# Patient Record
Sex: Female | Born: 1960 | Race: Black or African American | Hispanic: No | Marital: Single | Smoking: Never smoker
Health system: Southern US, Community
[De-identification: ages and names within clinical notes are randomized; demographics above are authoritative.]

## PROBLEM LIST (undated history)

## (undated) DIAGNOSIS — E669 Obesity, unspecified: Secondary | ICD-10-CM

## (undated) DIAGNOSIS — M2550 Pain in unspecified joint: Secondary | ICD-10-CM

## (undated) DIAGNOSIS — M069 Rheumatoid arthritis, unspecified: Secondary | ICD-10-CM

## (undated) DIAGNOSIS — M549 Dorsalgia, unspecified: Secondary | ICD-10-CM

## (undated) DIAGNOSIS — L930 Discoid lupus erythematosus: Secondary | ICD-10-CM

## (undated) HISTORY — DX: Discoid lupus erythematosus: L93.0

## (undated) HISTORY — DX: Pain in unspecified joint: M25.50

## (undated) HISTORY — PX: MOUTH SURGERY: SHX715

## (undated) HISTORY — DX: Obesity, unspecified: E66.9

## (undated) HISTORY — DX: Dorsalgia, unspecified: M54.9

## (undated) HISTORY — DX: Rheumatoid arthritis, unspecified: M06.9

---

## 2007-04-14 ENCOUNTER — Emergency Department: Payer: Self-pay | Admitting: Emergency Medicine

## 2007-04-18 ENCOUNTER — Emergency Department: Payer: Self-pay | Admitting: Emergency Medicine

## 2010-04-17 ENCOUNTER — Ambulatory Visit: Payer: Self-pay | Admitting: Family Medicine

## 2010-10-05 ENCOUNTER — Ambulatory Visit: Payer: Self-pay | Admitting: Family Medicine

## 2010-10-11 ENCOUNTER — Ambulatory Visit: Payer: Self-pay | Admitting: Family Medicine

## 2010-10-11 ENCOUNTER — Ambulatory Visit: Payer: Self-pay

## 2012-05-26 IMAGING — CR DG LUMBAR SPINE AP/LAT/OBLIQUES W/ FLEX AND EXT
1 series · 5 of 5 positions shown · non-contrast
Comparison: none

REASON FOR EXAM: Low back pain
COMMENTS:

PROCEDURE:     DXR - DXR LUMBAR SPINE WITH OBLIQUES  - October 11, 2010 [DATE]
RESULT:     Comparison: None.

[Series 1: view not recorded · 0.17mm/px · 5 of 5 slices shown]
[im 1/5]
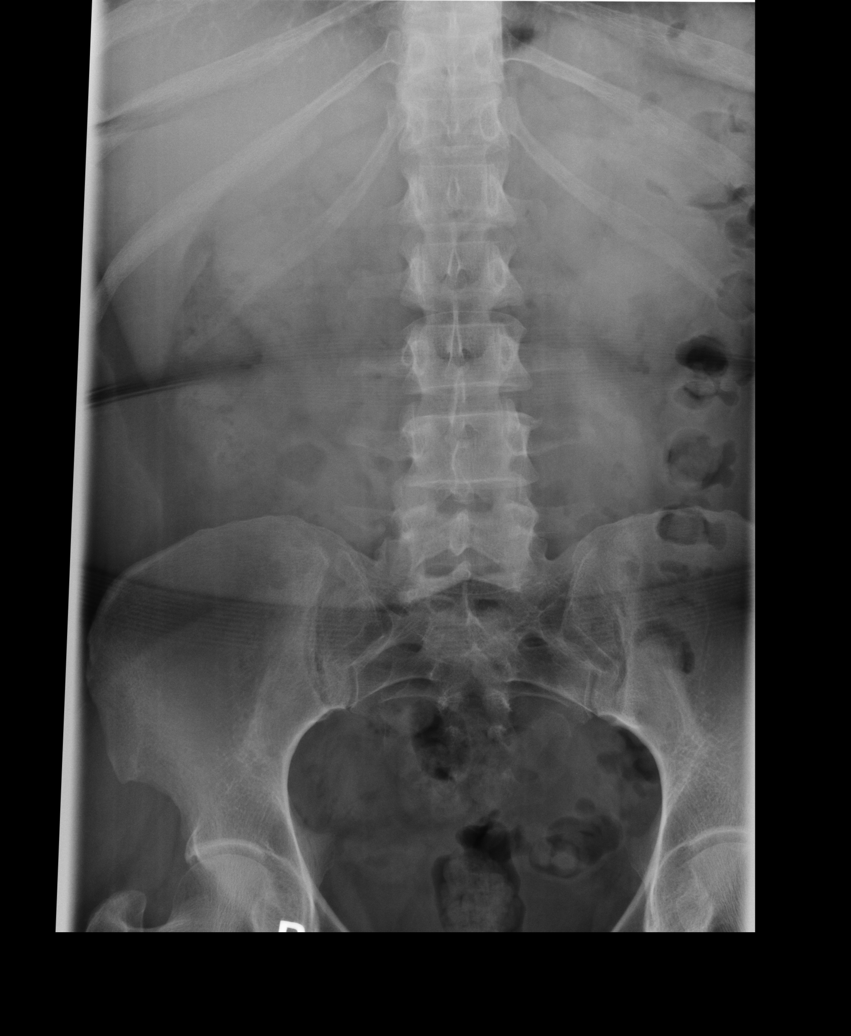
[im 2/5]
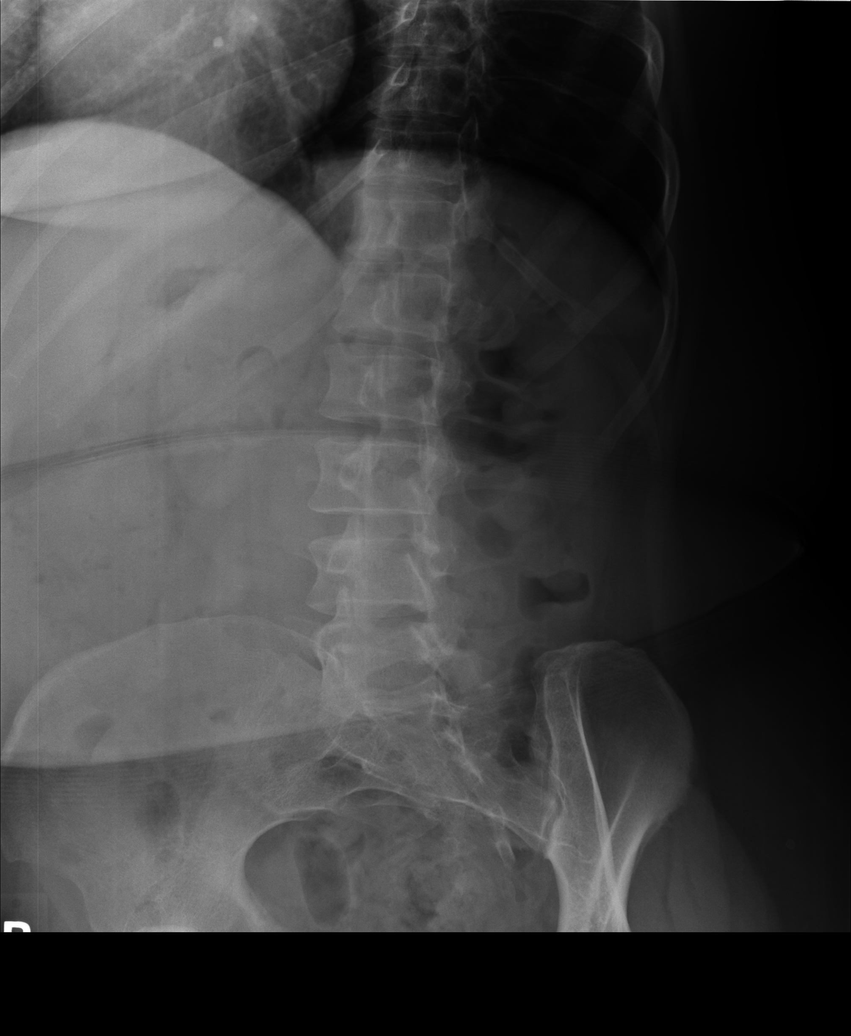
[im 3/5]
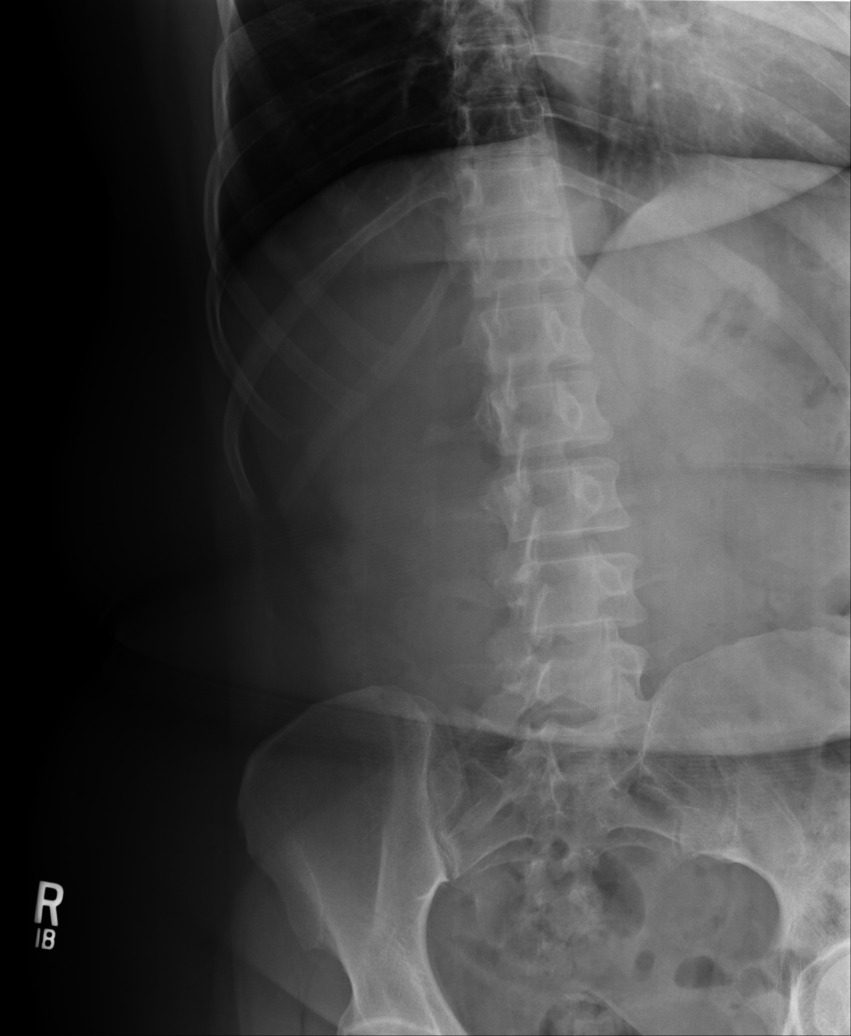
[im 4/5]
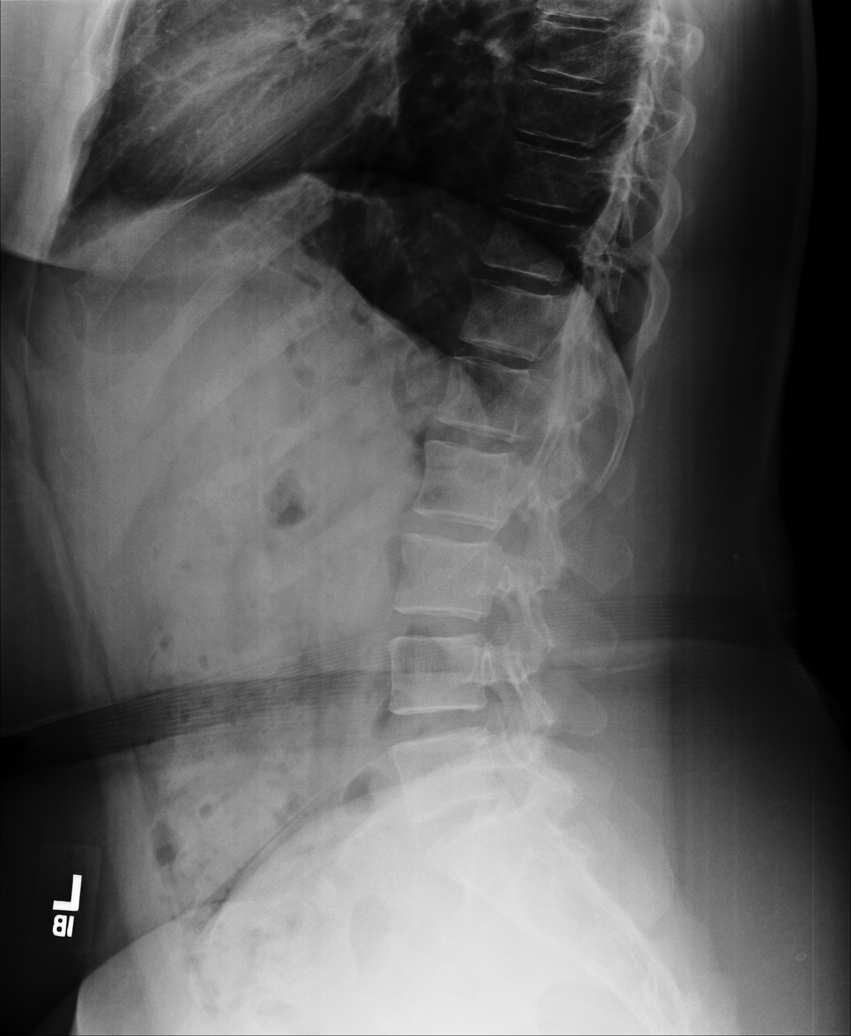
[im 5/5]
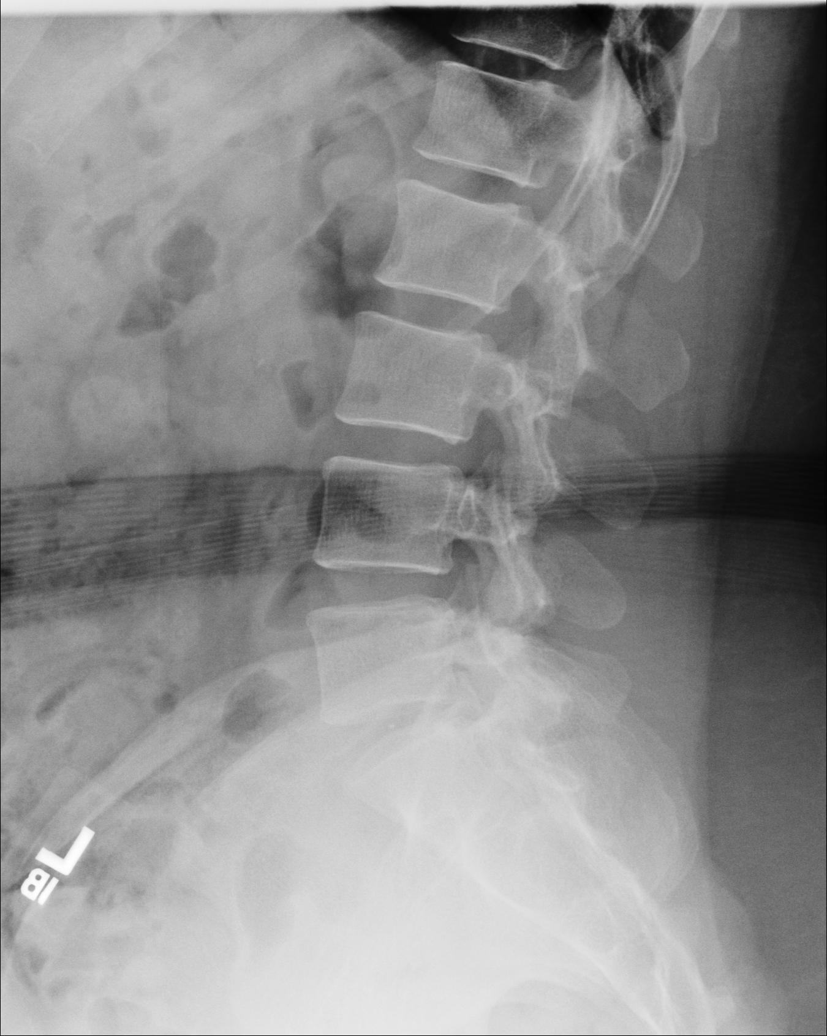

[5 of 5 positions shown; findings below may reference images not displayed]

FINDINGS: There are 5 lumbar type vertebral bodies. No pars defect seen on the oblique
views. Vertebral body heights and intervertebral disc heights are preserved.
There is normal alignment.
IMPRESSION: Unremarkable lumbar spine radiographs.

## 2012-05-26 IMAGING — CR DG KNEE COMPLETE 4+V*R*
1 series · 4 of 4 positions shown · non-contrast
Comparison: none

REASON FOR EXAM: right knee pain / low back pain
COMMENTS:

PROCEDURE:     DXR - DXR KNEE RT COMP WITH OBLIQUES  - October 11, 2010 [DATE]
RESULT:     Comparison: None.

[Series 1: view not recorded · 0.17mm/px · 4 of 4 slices shown]
[im 1/4]
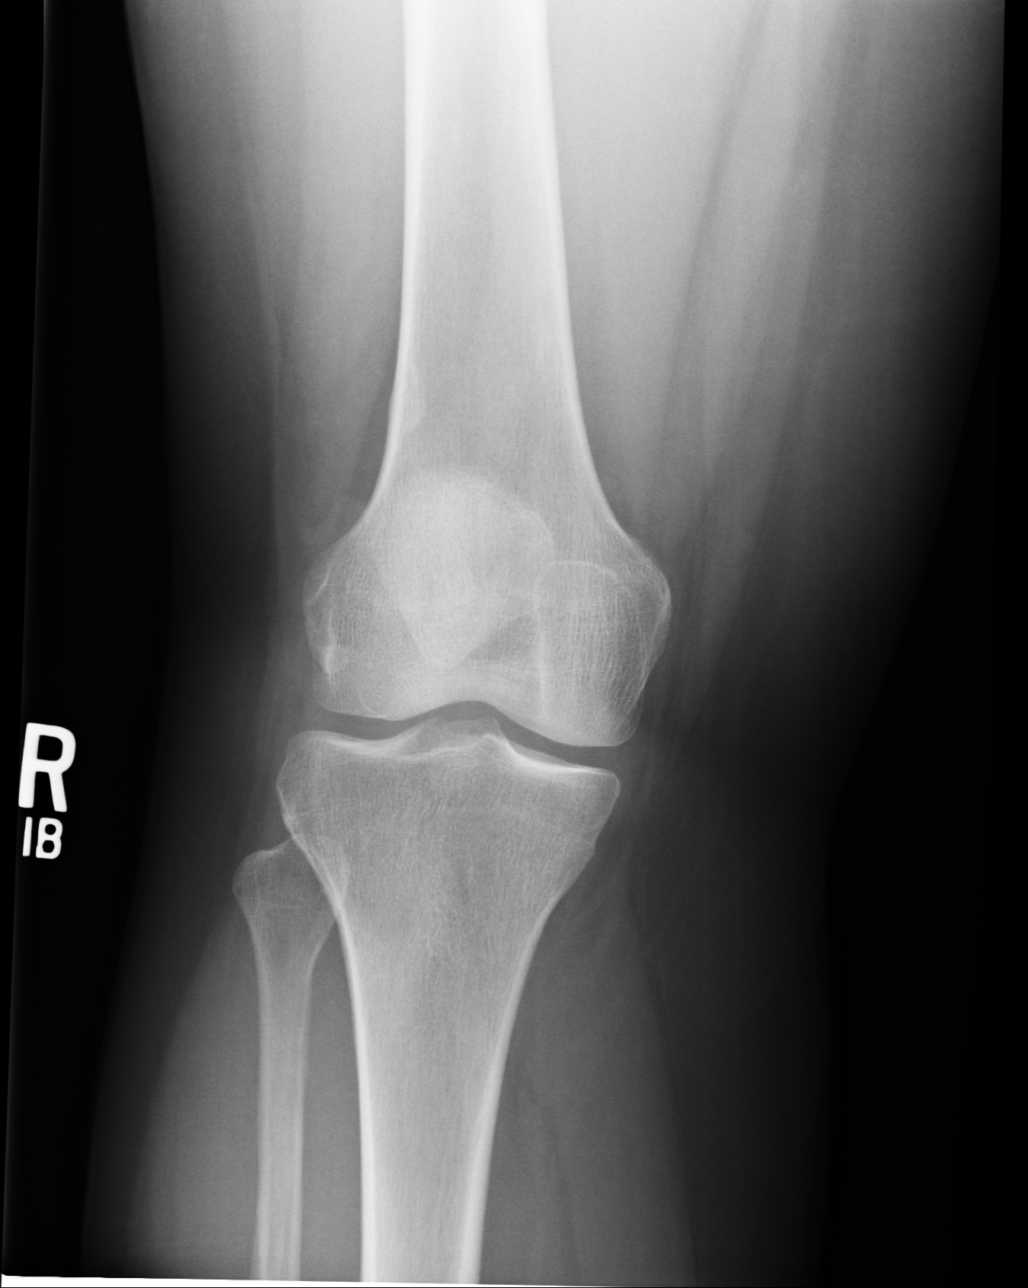
[im 2/4]
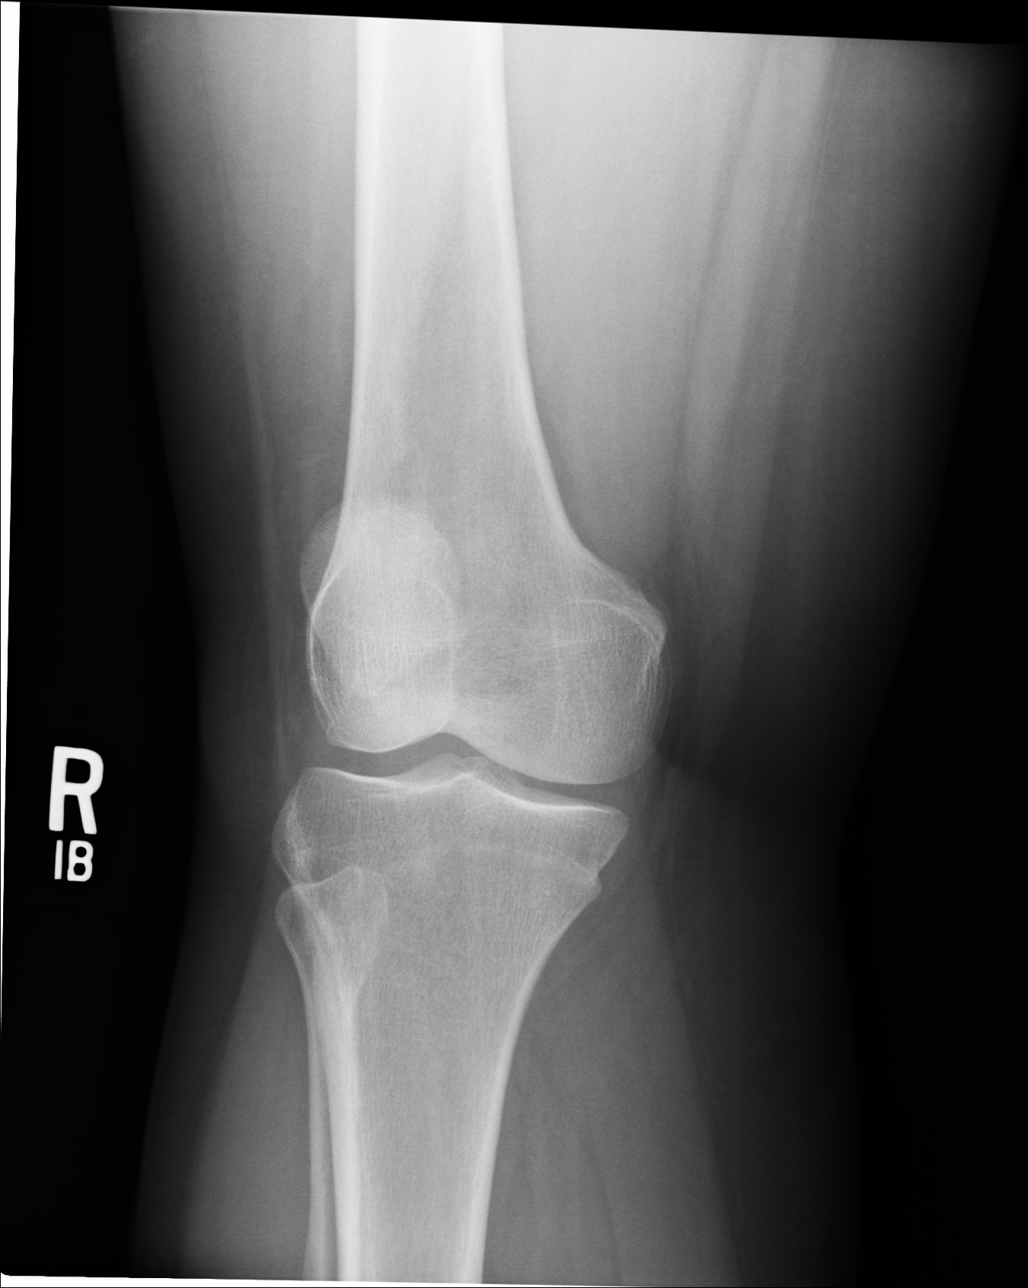
[im 3/4]
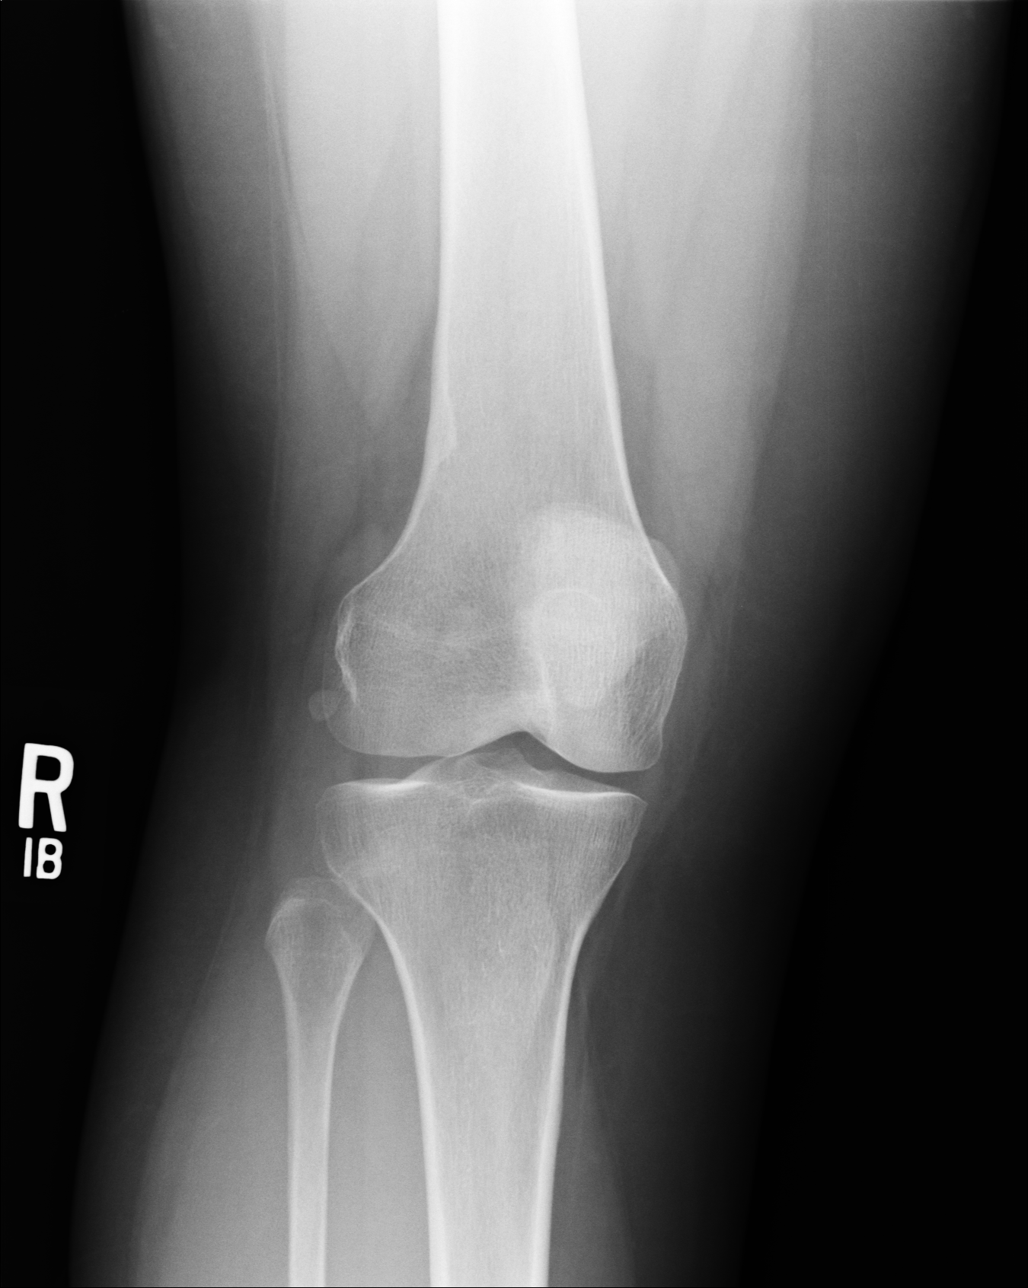
[im 4/4]
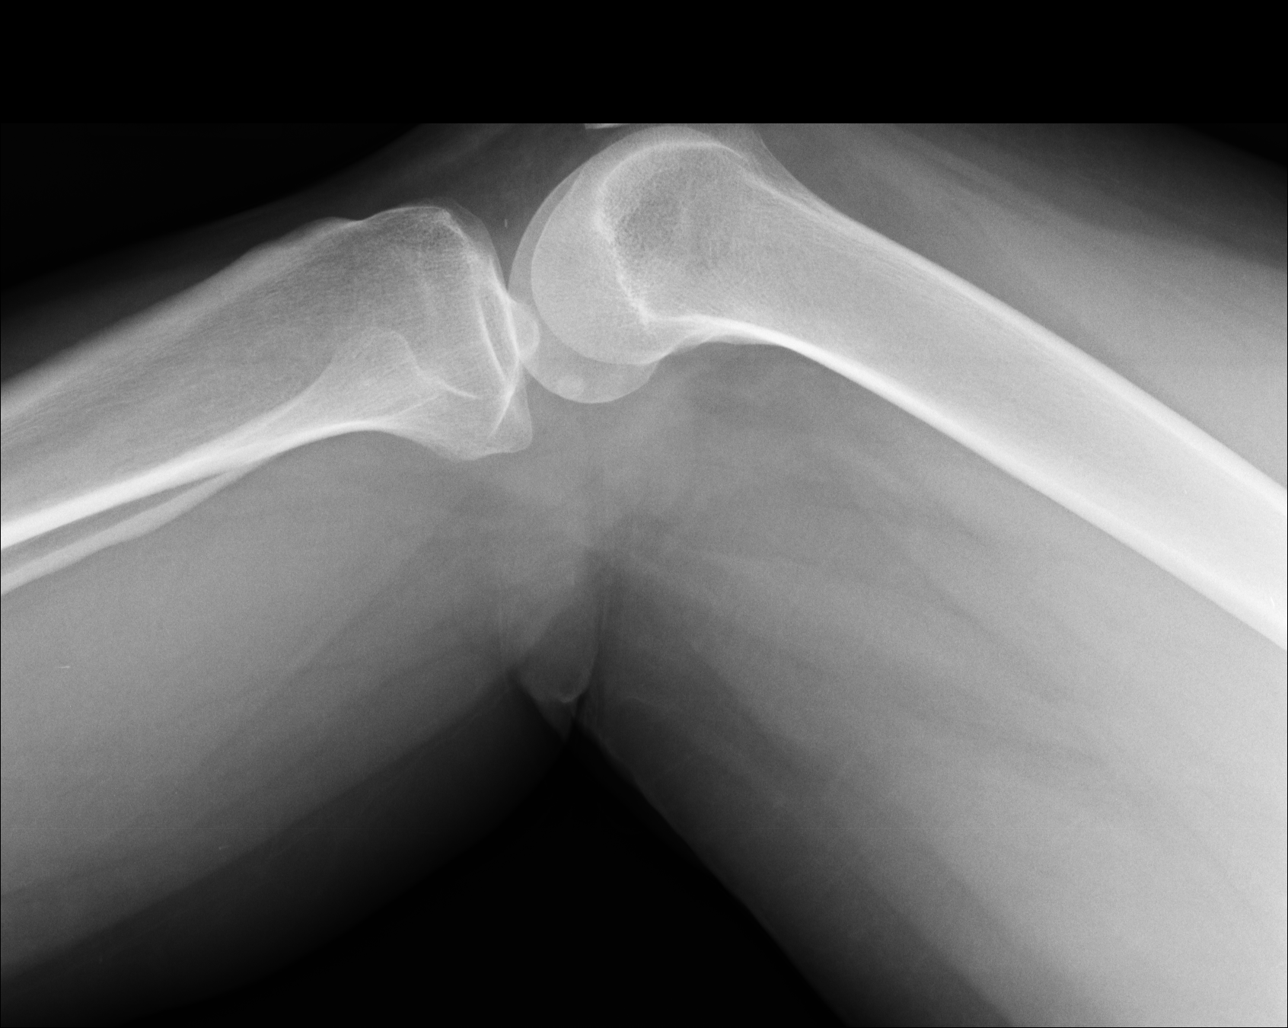

[4 of 4 positions shown; findings below may reference images not displayed]

FINDINGS: No acute fracture. Joint spaces are maintained. No significant effusion.
IMPRESSION: No acute findings.

## 2012-05-26 IMAGING — US US EXTREM LOW VENOUS*R*
1 series · 17 of 24 positions shown · non-contrast
Comparison: none

REASON FOR EXAM: swelling pain possible blood clot
COMMENTS:

[Series 1: us extrem low venous*right* · 17 of 30 slices shown]
[im 1/30]
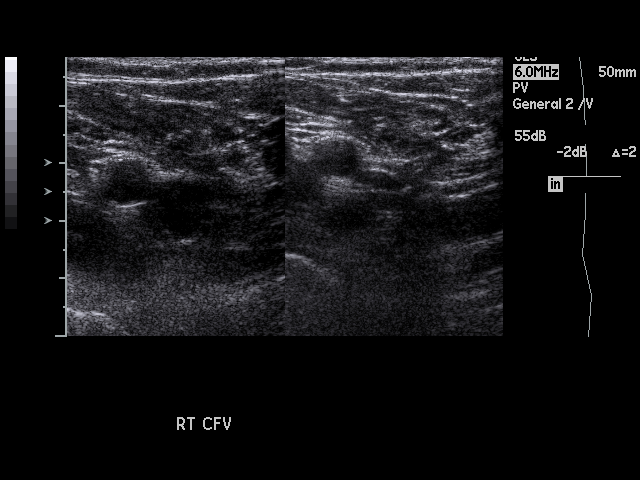
[im 3/30]
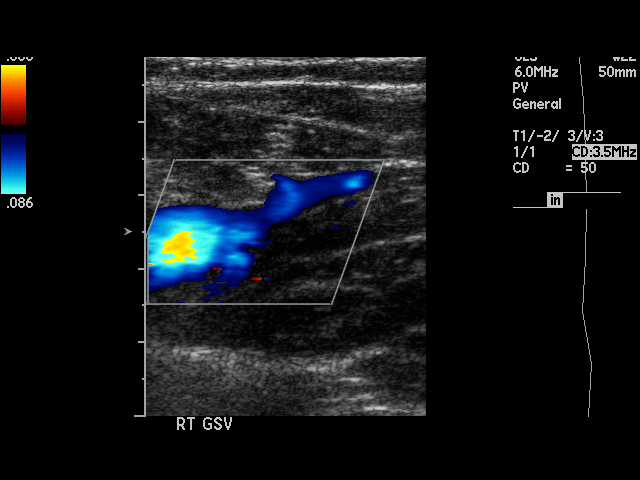
[im 4/30]
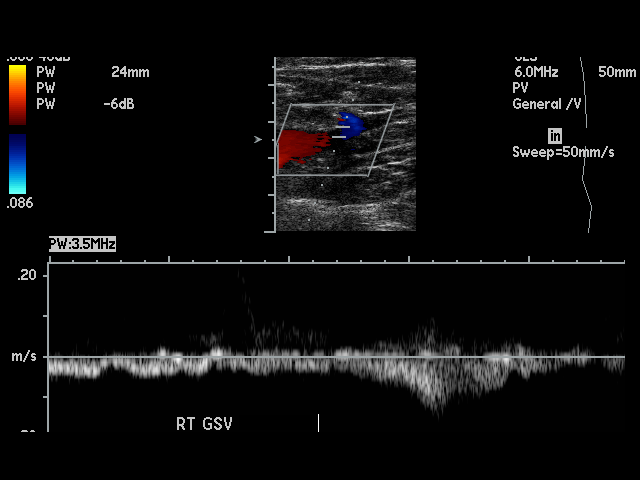
[im 6/30]
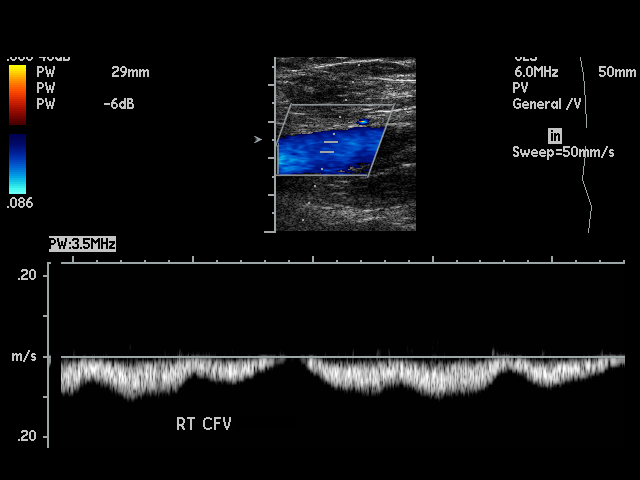
[im 8/30]
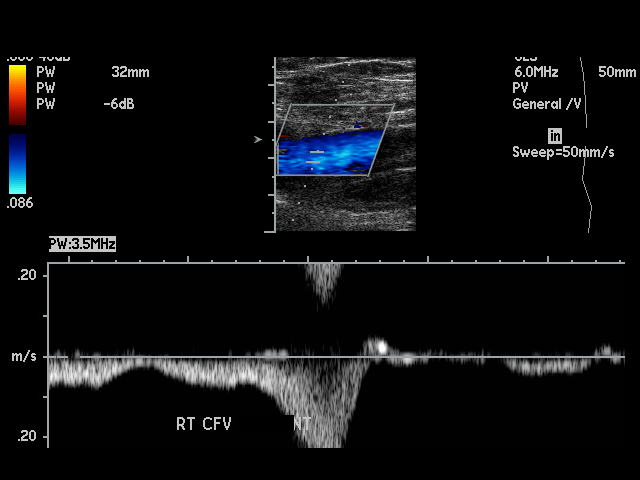
[im 9/30]
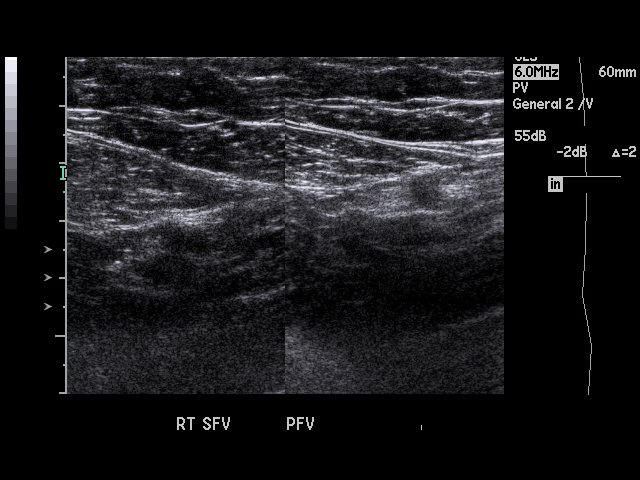
[im 12/30]
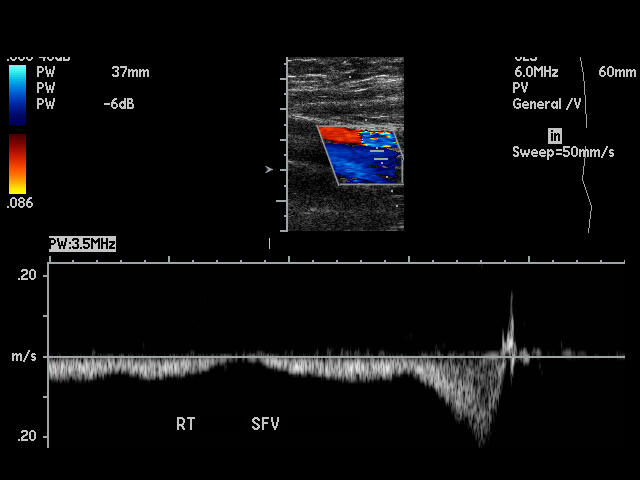
[im 13/30]
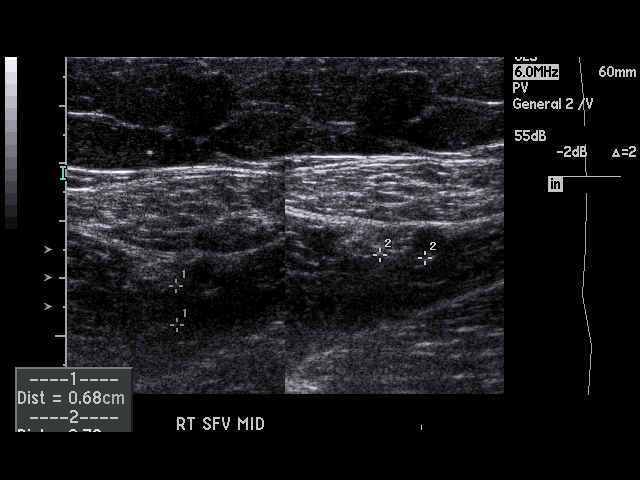
[im 16/30]
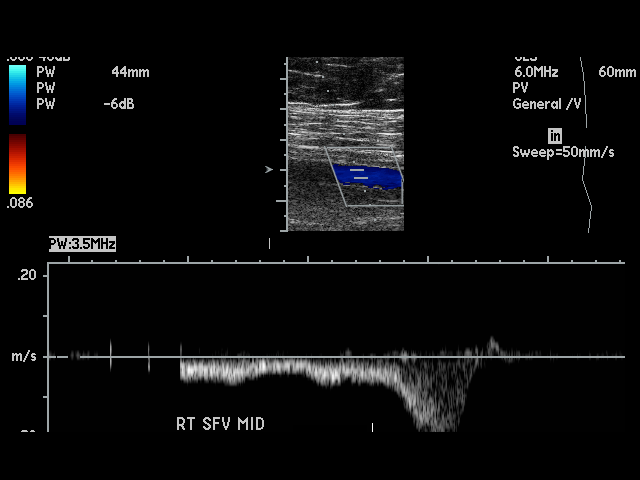
[im 17/30]
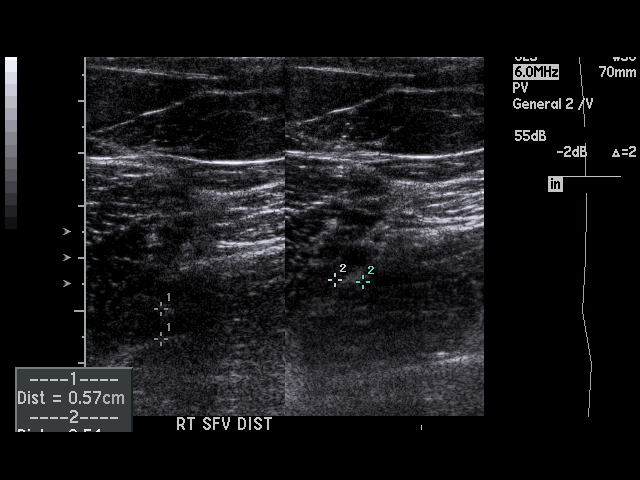
[im 18/30]
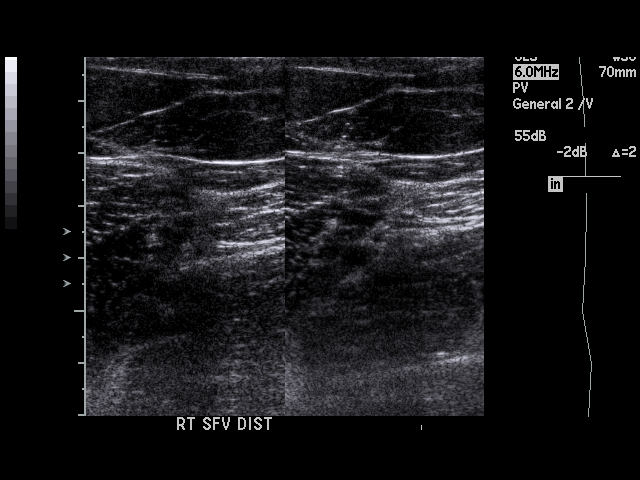
[im 21/30]
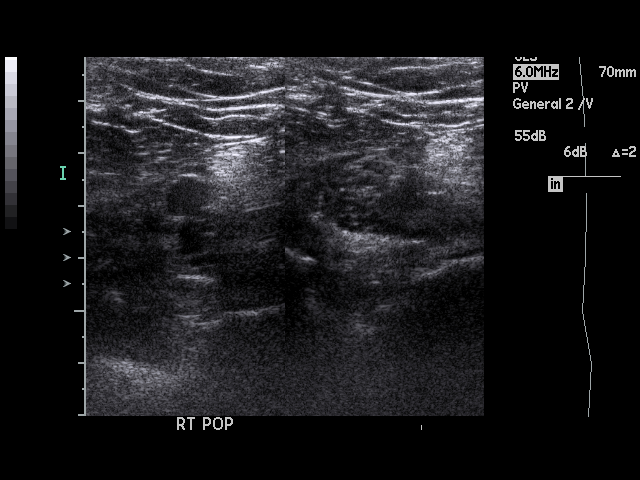
[im 22/30]
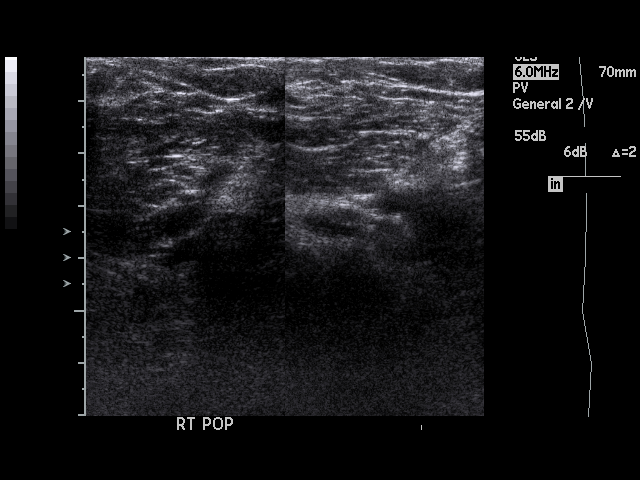
[im 24/30]
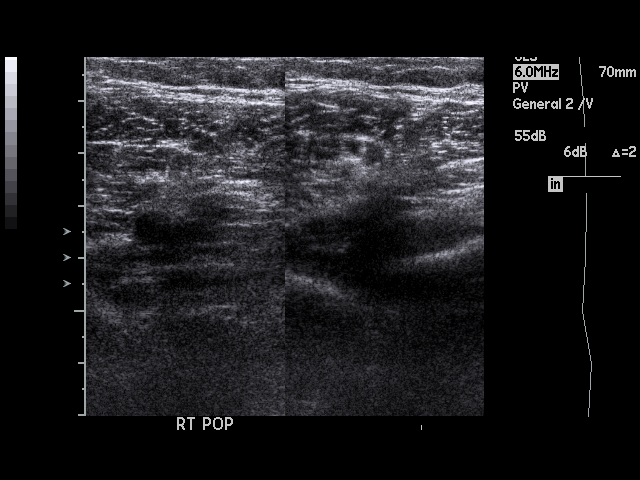
[im 26/30]
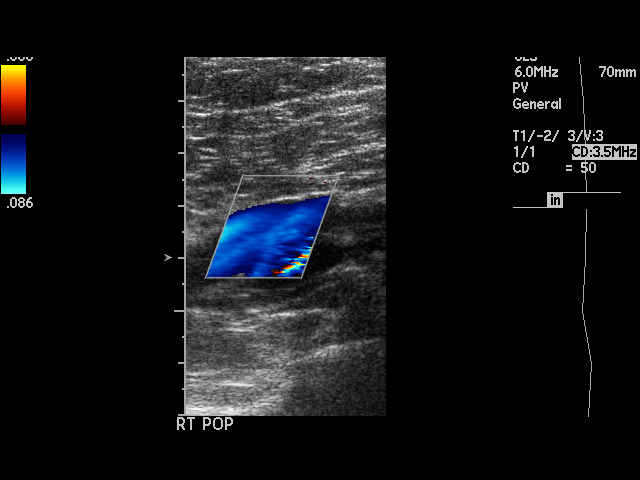
[im 27/30]
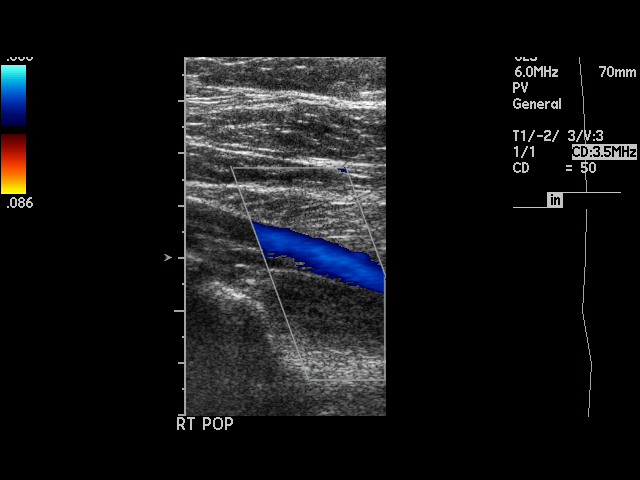
[im 30/30]
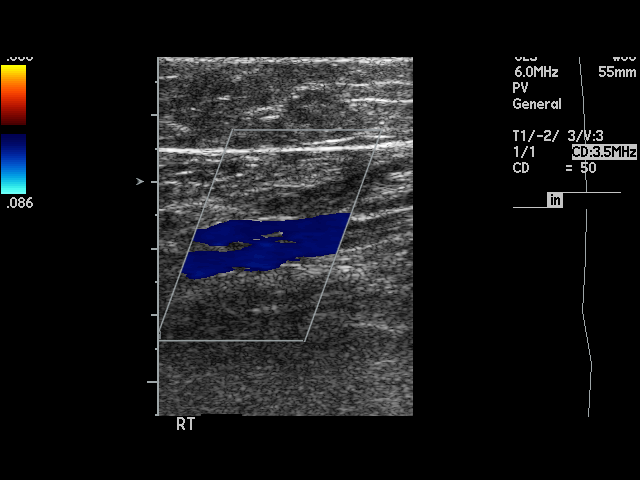

[17 of 24 positions shown; findings below may reference images not displayed]

PROCEDURE:     US  - US DOPPLER LOW EXTR RIGHT  - October 11, 2010 [DATE]

RESULT:     Comparison: None

Technique and findings: Multiple longitudinal and transverse grayscale as
well as color and spectral Doppler images of the right lower extremity veins
were obtained from the common femoral veins through the popliteal veins.

The right common femoral, femoral, and popliteal veins are patent,
demonstrating normal color-flow and compressibility. No intraluminal
thrombus is identified.  There is normal respiratory variation and
augmentation demonstrated at all vein levels.
IMPRESSION: No evidence of DVT in the right lower extremity.

## 2012-12-13 ENCOUNTER — Ambulatory Visit: Payer: Self-pay | Admitting: Gastroenterology

## 2012-12-16 LAB — PATHOLOGY REPORT

## 2013-07-01 ENCOUNTER — Ambulatory Visit: Payer: Self-pay | Admitting: Emergency Medicine

## 2013-07-01 LAB — RAPID STREP-A WITH REFLX: Micro Text Report: NEGATIVE

## 2013-07-03 LAB — BETA STREP CULTURE(ARMC)

## 2014-05-17 ENCOUNTER — Ambulatory Visit
Admit: 2014-05-17 | Disposition: A | Discharge status: Home / Self Care | Payer: Self-pay | Attending: Family Medicine | Admitting: Family Medicine

## 2014-06-10 ENCOUNTER — Ambulatory Visit
Admission: EM | Admit: 2014-06-10 | Discharge: 2014-06-10 | Disposition: A | Discharge status: Home / Self Care | Payer: 59 | Attending: Family Medicine | Admitting: Family Medicine

## 2014-06-10 DIAGNOSIS — H6501 Acute serous otitis media, right ear: Secondary | ICD-10-CM

## 2014-06-10 DIAGNOSIS — J019 Acute sinusitis, unspecified: Secondary | ICD-10-CM | POA: Diagnosis not present

## 2014-06-10 DIAGNOSIS — J029 Acute pharyngitis, unspecified: Secondary | ICD-10-CM

## 2014-06-10 MED ORDER — FLUTICASONE PROPIONATE 50 MCG/ACT NA SUSP: 2.0000 | Freq: Every day | NASAL | Status: DC

## 2014-06-10 MED ORDER — AMOXICILLIN 875 MG PO TABS: 875.0000 mg | ORAL_TABLET | Freq: Two times a day (BID) | ORAL | Status: DC

## 2014-06-10 MED ORDER — BENZONATATE 200 MG PO CAPS: 200.0000 mg | ORAL_CAPSULE | Freq: Three times a day (TID) | ORAL | Status: DC | PRN

## 2014-06-10 NOTE — ED Notes (Signed)
Pt states "I have sore throat, chills and fever for about a week." No acute distress noted.

## 2014-06-10 NOTE — Discharge Instructions (Signed)
Incidence: --Acute/subacute --Acute recurrent --Chronic  Laterality: --Right ear --Left ear --Bilateral  Types:  Serous  Suppurative or Nonsuppurative  Tubotympanic  Atticoantral  Allergic  Mucoid  Document if associated with: --Spontaneous rupture of tympanic membrane --Without spontaneous rupture of tympanic membrane --Infectious or other external agent --Exposure to environmental tobacco smoke  Document any other associated diagnoses/conditions  Supporting Information:   Thank You,  Sinusitis Sinusitis is redness, soreness, and inflammation of the paranasal sinuses. Paranasal sinuses are air pockets within the bones of your face (beneath the eyes, the middle of the forehead, or above the eyes). In healthy paranasal sinuses, mucus is able to drain out, and air is able to circulate through them by way of your nose. However, when your paranasal sinuses are inflamed, mucus and air can become trapped. This can allow bacteria and other germs to grow and cause infection. Sinusitis can develop quickly and last only a short time (acute) or continue over a long period (chronic). Sinusitis that lasts for more than 12 weeks is considered chronic.  CAUSES  Causes of sinusitis include:  Allergies.  Structural abnormalities, such as displacement of the cartilage that separates your nostrils (deviated septum), which can decrease the air flow through your nose and sinuses and affect sinus drainage.  Functional abnormalities, such as when the small hairs (cilia) that line your sinuses and help remove mucus do not work properly or are not present. SIGNS AND SYMPTOMS  Symptoms of acute and chronic sinusitis are the same. The primary symptoms are pain and pressure around the affected sinuses. Other symptoms include:  Upper toothache.  Earache.  Headache.  Bad breath.  Decreased sense of smell and taste.  A cough, which worsens when you are lying  flat.  Fatigue.  Fever.  Thick drainage from your nose, which often is green and may contain pus (purulent).  Swelling and warmth over the affected sinuses. DIAGNOSIS  Your health care provider will perform a physical exam. During the exam, your health care provider may:  Look in your nose for signs of abnormal growths in your nostrils (nasal polyps).  Tap over the affected sinus to check for signs of infection.  View the inside of your sinuses (endoscopy) using an imaging device that has a light attached (endoscope). If your health care provider suspects that you have chronic sinusitis, one or more of the following tests may be recommended:  Allergy tests.  Nasal culture. A sample of mucus is taken from your nose, sent to a lab, and screened for bacteria.  Nasal cytology. A sample of mucus is taken from your nose and examined by your health care provider to determine if your sinusitis is related to an allergy. TREATMENT  Most cases of acute sinusitis are related to a viral infection and will resolve on their own within 10 days. Sometimes medicines are prescribed to help relieve symptoms (pain medicine, decongestants, nasal steroid sprays, or saline sprays).  However, for sinusitis related to a bacterial infection, your health care provider will prescribe antibiotic medicines. These are medicines that will help kill the bacteria causing the infection.  Rarely, sinusitis is caused by a fungal infection. In theses cases, your health care provider will prescribe antifungal medicine. For some cases of chronic sinusitis, surgery is needed. Generally, these are cases in which sinusitis recurs more than 3 times per year, despite other treatments. HOME CARE INSTRUCTIONS   Drink plenty of water. Water helps thin the mucus so your sinuses can drain more easily.  Use a humidifier.  Inhale steam 3 to 4 times a day (for example, sit in the bathroom with the shower running).  Apply a warm,  moist washcloth to your face 3 to 4 times a day, or as directed by your health care provider.  Use saline nasal sprays to help moisten and clean your sinuses.  Take medicines only as directed by your health care provider.  If you were prescribed either an antibiotic or antifungal medicine, finish it all even if you start to feel better. SEEK IMMEDIATE MEDICAL CARE IF:  You have increasing pain or severe headaches.  You have nausea, vomiting, or drowsiness.  You have swelling around your face.  You have vision problems.  You have a stiff neck.  You have difficulty breathing. MAKE SURE YOU:   Understand these instructions.  Will watch your condition.  Will get help right away if you are not doing well or get worse. Document Released: 01/23/2005 Document Revised: 06/09/2013 Document Reviewed: 02/07/2011 [facility] Patient Information 2015 [place], [place]. This information is not intended to replace advice given to you by your health care provider. Make sure you discuss any questions you have with your health care provider.

## 2014-06-10 NOTE — ED Provider Notes (Signed)
CSN: [id number]     Arrival date & time 06/10/14  1126 History   None    Chief Complaint  Patient presents with  . Sore Throat  . Cough    Patient is a 54 y.o. female presenting with pharyngitis and cough. The history is provided by the patient.  Sore Throat This is a new problem. The current episode started more than 2 days ago (5-6 days). The problem occurs constantly. Associated symptoms comments: Fevers, chills, cough, sinus pressure, sinus headaches. Treatments tried: robittusin. The treatment provided no relief.  Cough   History reviewed. No pertinent past medical history. History reviewed. No pertinent past surgical history. History reviewed. No pertinent family history. History  Substance Use Topics  . Smoking status: Never Smoker   . Smokeless tobacco: Not on file  . Alcohol Use: Yes   OB History    No data available     Review of Systems  Respiratory: Positive for cough.     Allergies  Bactrim  Home Medications   Prior to Admission medications   Medication Sig Start Date End Date Taking? Authorizing Provider  amoxicillin (AMOXIL) 875 MG tablet Take 1 tablet (875 mg total) by mouth 2 (two) times daily. 06/10/14   Payton Mccallum, MD  benzonatate (TESSALON) 200 MG capsule Take 1 capsule (200 mg total) by mouth 3 (three) times daily as needed for cough. 06/10/14   Payton Mccallum, MD  fluticasone (FLONASE) 50 MCG/ACT nasal spray Place 2 sprays into both nostrils daily. 06/10/14   Payton Mccallum, MD   BP 138/81 mmHg  Temp(Src) 97.5 F (36.4 C) (Tympanic)  Resp 16  Ht 5\' 1"  (1.549 m)  Wt 200 lb (90.719 kg)  BMI 37.81 kg/m2  SpO2 98% Physical Exam  Constitutional: She appears well-developed and well-nourished. No distress.  HENT:  Head: Normocephalic and atraumatic.  Right Ear: External ear and ear canal normal. Tympanic membrane is erythematous and bulging.  Left Ear: Tympanic membrane, external ear and ear canal normal.  Nose: Mucosal edema and rhinorrhea present. No  nose lacerations, sinus tenderness, nasal deformity, septal deviation or nasal septal hematoma. No epistaxis.  No foreign bodies. Right sinus exhibits maxillary sinus tenderness and frontal sinus tenderness. Left sinus exhibits maxillary sinus tenderness and frontal sinus tenderness.  Mouth/Throat: Uvula is midline, oropharynx is clear and moist and mucous membranes are normal. No oropharyngeal exudate.  Eyes: Conjunctivae and EOM are normal. Pupils are equal, round, and reactive to light. Right eye exhibits no discharge. Left eye exhibits no discharge. No scleral icterus.  Neck: Normal range of motion. Neck supple. No thyromegaly present.  Cardiovascular: Normal rate, regular rhythm and normal heart sounds.   Pulmonary/Chest: Effort normal and breath sounds normal. No respiratory distress. She has no wheezes. She has no rales.  Lymphadenopathy:    She has no cervical adenopathy.  Skin: She is not diaphoretic.  Nursing note and vitals reviewed.   ED Course  Procedures (including critical care time) Labs Review Labs Reviewed - No data to display  Imaging Review No results found.   MDM   1. Acute sinusitis with symptoms greater than 10 days   2. Pharyngitis   3. Right acute serous otitis media, recurrence not specified     Plan: 1. Test/x-ray results and diagnosis reviewed with patient 2. rx as per orders above; risks, benefits, potential side effects reviewed with patient 3. Recommend supportive treatment with rest, fluids 4. F/u prn if symptoms worsen or don't improve  , MD 06/10/14  1332 

## 2014-10-29 ENCOUNTER — Encounter: Payer: Self-pay | Admitting: Family Medicine

## 2014-10-29 ENCOUNTER — Ambulatory Visit (INDEPENDENT_AMBULATORY_CARE_PROVIDER_SITE_OTHER): Payer: 59 | Admitting: Family Medicine

## 2014-10-29 ENCOUNTER — Other Ambulatory Visit: Payer: Self-pay | Admitting: Family Medicine

## 2014-10-29 VITALS — BP 118/79 | HR 88 | Temp 98.8°F | Ht 61.7 in | Wt 199.0 lb

## 2014-10-29 DIAGNOSIS — Z3042 Encounter for surveillance of injectable contraceptive: Secondary | ICD-10-CM

## 2014-10-29 DIAGNOSIS — Z3049 Encounter for surveillance of other contraceptives: Secondary | ICD-10-CM | POA: Diagnosis not present

## 2014-10-29 DIAGNOSIS — Z0000 Encounter for general adult medical examination without abnormal findings: Secondary | ICD-10-CM | POA: Diagnosis not present

## 2014-10-29 DIAGNOSIS — Z23 Encounter for immunization: Secondary | ICD-10-CM | POA: Diagnosis not present

## 2014-10-29 LAB — URINALYSIS, ROUTINE W REFLEX MICROSCOPIC
Bilirubin, UA: NEGATIVE
Glucose, UA: NEGATIVE
Ketones, UA: NEGATIVE
Nitrite, UA: NEGATIVE
Protein, UA: NEGATIVE
Specific Gravity, UA: 1.005 (ref 1.005–1.030)
Urobilinogen, Ur: 0.2 mg/dL (ref 0.2–1.0)
pH, UA: 5.5 (ref 5.0–7.5)

## 2014-10-29 LAB — MICROSCOPIC EXAMINATION: Renal Epithel, UA: NONE SEEN /hpf

## 2014-10-29 LAB — PREGNANCY, URINE: Preg Test, Ur: NEGATIVE

## 2014-10-29 MED ORDER — MEDROXYPROGESTERONE ACETATE 150 MG/ML IM SUSP: 150.0000 mg | Freq: Once | INTRAMUSCULAR | Status: DC

## 2014-10-29 MED ORDER — MEDROXYPROGESTERONE ACETATE 150 MG/ML IM SUSP
150.0000 mg | Freq: Once | INTRAMUSCULAR | Status: AC
  Administered 2014-10-29: 150 mg via INTRAMUSCULAR

## 2014-10-29 MED ORDER — FLUTICASONE PROPIONATE 50 MCG/ACT NA SUSP: 2.0000 | Freq: Every day | NASAL | Status: DC

## 2014-10-29 NOTE — Progress Notes (Signed)
BP 118/79 mmHg  Pulse 88  Temp(Src) 98.8 F (37.1 C)  Ht 5' 1.7" (1.567 m)  Wt 199 lb (90.266 kg)  BMI 36.76 kg/m2  SpO2 99%  LMP 10/19/2014 (Exact Date)   Subjective:    Patient ID: Stacey Marshall, female    DOB: 12/16/1960, 54 y.o.   MRN: [id number]  HPI: Stacey Marshall is a 54 y.o. female  Chief Complaint  Patient presents with  . Annual Exam  doing well but having problems with wt cant loose Has 1 glass of wine 4-5 times a week Does exercise. Still having periods every mo (like clockwork)  Birth control is being careful I discussed that pt can still get pregnant and discussed options.  Relevant past medical, surgical, family and social history reviewed and updated as indicated. Interim medical history since our last visit reviewed. Allergies and medications reviewed and updated.  Review of Systems  Constitutional: Negative.   HENT: Negative.   Eyes: Negative.   Respiratory: Negative.   Cardiovascular: Negative.   Gastrointestinal: Negative.   Endocrine: Negative.   Genitourinary: Negative.   Musculoskeletal: Negative.   Skin: Negative.   Allergic/Immunologic: Negative.   Neurological: Negative.   Hematological: Negative.   Psychiatric/Behavioral: Negative.     Per HPI unless specifically indicated above     Objective:    BP 118/79 mmHg  Pulse 88  Temp(Src) 98.8 F (37.1 C)  Ht 5' 1.7" (1.567 m)  Wt 199 lb (90.266 kg)  BMI 36.76 kg/m2  SpO2 99%  LMP 10/19/2014 (Exact Date)  Wt Readings from Last 3 Encounters:  10/29/14 199 lb (90.266 kg)  10/27/13 202 lb (91.627 kg)  06/10/14 200 lb (90.719 kg)    Physical Exam  Constitutional: She is oriented to person, place, and time. She appears well-developed and well-nourished.  HENT:  Head: Normocephalic and atraumatic.  Right Ear: External ear normal.  Left Ear: External ear normal.  Nose: Nose normal.  Mouth/Throat: Oropharynx is clear and moist.  Eyes: Conjunctivae and EOM are normal.  Pupils are equal, round, and reactive to light.  Neck: Normal range of motion. Neck supple. Carotid bruit is not present.  Cardiovascular: Normal rate, regular rhythm and normal heart sounds.   No murmur heard. Pulmonary/Chest: Effort normal and breath sounds normal.  Abdominal: Soft. Bowel sounds are normal. There is no hepatosplenomegaly.  Genitourinary: Vagina normal and uterus normal. No vaginal discharge found.  Musculoskeletal: Normal range of motion.  Neurological: She is alert and oriented to person, place, and time.  Skin: No rash noted.  Psychiatric: She has a normal mood and affect. Her behavior is normal. Judgment and thought content normal.        Assessment & Plan:   Problem List Items Addressed This Visit    None    Visit Diagnoses    Immunization due    -  Primary    Relevant Orders    Flu Vaccine QUAD 36+ mos PF IM (Fluarix & Fluzone Quad PF) (Completed)    PE (physical exam), annual        Relevant Orders    Comprehensive metabolic panel    Lipid panel    CBC with Differential/Platelet    Urinalysis, Routine w reflex microscopic (not at [facility])    TSH      discussed wt loss and exercise. Discussed birth control patient elects to go back on Depo Provera and she did very well on that gave prescription Follow up plan: Return in about 1  year (around 10/29/2015), or if symptoms worsen or fail to improve, for Physical Exam.

## 2014-10-29 NOTE — Addendum Note (Signed)
Addended by: Verdon Cummins L on: 10/29/2014 09:09 AM   Modules accepted: Orders

## 2014-10-30 LAB — LIPID PANEL
Chol/HDL Ratio: 4.3 ratio units (ref 0.0–4.4)
Cholesterol, Total: 202 mg/dL — ABNORMAL HIGH (ref 100–199)
HDL: 47 mg/dL (ref >39)
LDL Calculated: 139 mg/dL — ABNORMAL HIGH (ref 0–99)
Triglycerides: 82 mg/dL (ref 0–149)
VLDL Cholesterol Cal: 16 mg/dL (ref 5–40)

## 2014-10-30 LAB — COMPREHENSIVE METABOLIC PANEL
ALT: 18 IU/L (ref 0–32)
AST: 20 IU/L (ref 0–40)
Albumin/Globulin Ratio: 1.4 (ref 1.1–2.5)
Albumin: 4 g/dL (ref 3.5–5.5)
Alkaline Phosphatase: 75 IU/L (ref 39–117)
BUN/Creatinine Ratio: 10 (ref 9–23)
BUN: 10 mg/dL (ref 6–24)
Bilirubin Total: 0.4 mg/dL (ref 0.0–1.2)
CO2: 23 mmol/L (ref 18–29)
Calcium: 9.2 mg/dL (ref 8.7–10.2)
Chloride: 103 mmol/L (ref 97–108)
Creatinine, Ser: 0.99 mg/dL (ref 0.57–1.00)
GFR calc Af Amer: 76 mL/min/{1.73_m2} (ref >59)
GFR calc non Af Amer: 66 mL/min/{1.73_m2} (ref >59)
Globulin, Total: 2.9 g/dL (ref 1.5–4.5)
Glucose: 97 mg/dL (ref 65–99)
Potassium: 3.9 mmol/L (ref 3.5–5.2)
Sodium: 140 mmol/L (ref 134–144)
Total Protein: 6.9 g/dL (ref 6.0–8.5)

## 2014-10-30 LAB — CBC WITH DIFFERENTIAL/PLATELET
Basophils Absolute: 0 10*3/uL (ref 0.0–0.2)
Basos: 0 %
EOS (ABSOLUTE): 0.2 10*3/uL (ref 0.0–0.4)
Eos: 4 %
Hematocrit: 39.5 % (ref 34.0–46.6)
Hemoglobin: 13.2 g/dL (ref 11.1–15.9)
Immature Grans (Abs): 0 10*3/uL (ref 0.0–0.1)
Immature Granulocytes: 0 %
Lymphocytes Absolute: 2.3 10*3/uL (ref 0.7–3.1)
Lymphs: 38 %
MCH: 31.2 pg (ref 26.6–33.0)
MCHC: 33.4 g/dL (ref 31.5–35.7)
MCV: 93 fL (ref 79–97)
Monocytes Absolute: 0.5 10*3/uL (ref 0.1–0.9)
Monocytes: 8 %
Neutrophils Absolute: 3 10*3/uL (ref 1.4–7.0)
Neutrophils: 50 %
Platelets: 216 10*3/uL (ref 150–379)
RBC: 4.23 x10E6/uL (ref 3.77–5.28)
RDW: 14 % (ref 12.3–15.4)
WBC: 6 10*3/uL (ref 3.4–10.8)

## 2014-10-30 LAB — TSH: TSH: 1.36 u[IU]/mL (ref 0.450–4.500)

## 2014-11-02 ENCOUNTER — Encounter: Payer: Self-pay | Admitting: Family Medicine

## 2014-11-17 ENCOUNTER — Telehealth: Payer: Self-pay | Admitting: Family Medicine

## 2014-11-17 NOTE — Telephone Encounter (Signed)
Pt would like to go to [facility] for breast exam. Please call when she is able to make that appt.

## 2014-11-17 NOTE — Telephone Encounter (Signed)
Form/order filled out and faxed..the patient notified

## 2015-01-14 ENCOUNTER — Other Ambulatory Visit: Payer: Self-pay | Admitting: Family Medicine

## 2015-01-14 ENCOUNTER — Telehealth: Payer: Self-pay | Admitting: Family Medicine

## 2015-01-14 ENCOUNTER — Telehealth: Payer: Self-pay

## 2015-01-14 MED ORDER — MEDROXYPROGESTERONE ACETATE 150 MG/ML IM SUSP: 150.0000 mg | Freq: Once | INTRAMUSCULAR | Status: DC

## 2015-01-14 NOTE — Telephone Encounter (Signed)
Left patient message to check with her insurance BEFORE she gets Shingles Vaccine to verify coverage

## 2015-01-14 NOTE — Telephone Encounter (Signed)
Pt would like to get shingles vaccine when here on Monday for her depo.  Wanted to make sure we could do this, please let me know if we cannot, otherwise please add order for her to have done Monday 01/18/15.

## 2015-01-14 NOTE — Telephone Encounter (Signed)
Expand All Collapse All   Pt would like to get shingles vaccine when here on Monday for her depo. Wanted to make sure we could do this, please let me know if we cannot, otherwise please add order for her to have done Monday 01/18/15.       ask patient to check with her insurance otherwise we will be happy to do so

## 2015-01-18 ENCOUNTER — Ambulatory Visit (INDEPENDENT_AMBULATORY_CARE_PROVIDER_SITE_OTHER): Payer: 59

## 2015-01-18 ENCOUNTER — Ambulatory Visit: Payer: 59

## 2015-01-18 DIAGNOSIS — Z3049 Encounter for surveillance of other contraceptives: Secondary | ICD-10-CM | POA: Diagnosis not present

## 2015-01-18 DIAGNOSIS — Z3042 Encounter for surveillance of injectable contraceptive: Secondary | ICD-10-CM

## 2015-01-18 MED ORDER — MEDROXYPROGESTERONE ACETATE 150 MG/ML IM SUSP
150.0000 mg | Freq: Once | INTRAMUSCULAR | Status: AC
  Administered 2015-01-18: 150 mg via INTRAMUSCULAR

## 2015-03-23 ENCOUNTER — Encounter: Payer: Self-pay | Admitting: Family Medicine

## 2015-04-09 ENCOUNTER — Ambulatory Visit: Payer: Self-pay

## 2015-04-09 ENCOUNTER — Ambulatory Visit (INDEPENDENT_AMBULATORY_CARE_PROVIDER_SITE_OTHER): Payer: 59

## 2015-04-09 DIAGNOSIS — Z3049 Encounter for surveillance of other contraceptives: Secondary | ICD-10-CM

## 2015-04-09 DIAGNOSIS — Z3042 Encounter for surveillance of injectable contraceptive: Secondary | ICD-10-CM

## 2015-04-09 MED ORDER — MEDROXYPROGESTERONE ACETATE 150 MG/ML IM SUSP
150.0000 mg | Freq: Once | INTRAMUSCULAR | Status: AC
  Administered 2015-04-09: 150 mg via INTRAMUSCULAR

## 2015-06-23 ENCOUNTER — Ambulatory Visit (INDEPENDENT_AMBULATORY_CARE_PROVIDER_SITE_OTHER): Payer: 59 | Admitting: Unknown Physician Specialty

## 2015-06-23 ENCOUNTER — Encounter: Payer: Self-pay | Admitting: Unknown Physician Specialty

## 2015-06-23 VITALS — BP 135/85 | HR 92 | Temp 99.0°F | Ht 61.7 in | Wt 209.8 lb

## 2015-06-23 DIAGNOSIS — M7918 Myalgia, other site: Secondary | ICD-10-CM

## 2015-06-23 DIAGNOSIS — M797 Fibromyalgia: Secondary | ICD-10-CM

## 2015-06-23 DIAGNOSIS — M542 Cervicalgia: Secondary | ICD-10-CM | POA: Diagnosis not present

## 2015-06-23 MED ORDER — CYCLOBENZAPRINE HCL 10 MG PO TABS: 10.0000 mg | ORAL_TABLET | Freq: Every day | ORAL | Status: DC

## 2015-06-23 MED ORDER — NAPROXEN 500 MG PO TABS: 500.0000 mg | ORAL_TABLET | Freq: Two times a day (BID) | ORAL | Status: DC

## 2015-06-23 NOTE — Progress Notes (Signed)
BP 135/85 mmHg  Pulse 92  Temp(Src) 99 F (37.2 C)  Ht 5' 1.7" (1.567 m)  Wt 209 lb 12.8 oz (95.165 kg)  BMI 38.76 kg/m2  SpO2 99%   Subjective:    Patient ID: Stacey Marshall, female    DOB: 1960/09/27, 55 y.o.   MRN: [id number]  HPI: Stacey Marshall is a 55 y.o. female  Chief Complaint  Patient presents with  . Back Pain    pt states she has been having pain in upper back and neck for about a week now. She states it is like a buring sensation and sometimes burns in her arms (mainly the right). also states lower back hurts but mainly upper back and gets a quick sharp pain every now again   Back pain Pt is having pain in her upper back and neck for about 1 1/2 weeks.  States pain is worse at night.  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  No numbness in arms just burning sensation right arm today.  No fever, nausea, or vomiting.    Relevant past medical, surgical, family and social history reviewed and updated as indicated. Interim medical history since our last visit reviewed. Allergies and medications reviewed and updated.  Review of Systems  Per HPI unless specifically indicated above     Objective:    BP 135/85 mmHg  Pulse 92  Temp(Src) 99 F (37.2 C)  Ht 5' 1.7" (1.567 m)  Wt 209 lb 12.8 oz (95.165 kg)  BMI 38.76 kg/m2  SpO2 99%  Wt Readings from Last 3 Encounters:  06/23/15 209 lb 12.8 oz (95.165 kg)  10/29/14 199 lb (90.266 kg)  10/27/13 202 lb (91.627 kg)    Physical Exam  Constitutional: She is oriented to person, place, and time. She appears well-developed and well-nourished. No distress.  HENT:  Head: Normocephalic and atraumatic.  Eyes: Conjunctivae and lids are normal. Right eye exhibits no discharge. Left eye exhibits no discharge. No scleral icterus.  Neck: Muscular tenderness present. No spinous process tenderness present.  Decreased range of motion present. No edema present.  Cardiovascular: Normal rate.   Pulmonary/Chest: Effort normal.  Abdominal: Normal appearance. There is no splenomegaly or hepatomegaly.  Musculoskeletal:  Bilateral paravetebral muscle tenderness.    Neurological: She is alert and oriented to person, place, and time.  Skin: Skin is intact. No rash noted. No pallor.  Psychiatric: She has a normal mood and affect. Her behavior is normal. Judgment and thought content normal.    Results for orders placed or performed in visit on 10/29/14  Microscopic Examination  Result Value Ref Range   WBC, UA 0-5 0 -  5 /hpf   RBC, UA 0-2 0 -  2 /hpf   Epithelial Cells (non renal) 0-10 0 - 10 /hpf   Renal Epithel, UA None seen None seen /hpf   Crystals Present (A) N/A   Crystal Type Amorphous Sediment N/A   Bacteria, UA Few None seen/Few  Comprehensive metabolic panel  Result Value Ref Range   Glucose 97 65 - 99 mg/dL   BUN 10 6 - 24 mg/dL   Creatinine, Ser 5.80 0.57 - 1.00 mg/dL   GFR calc non Af Amer 66 >59 mL/min/1.73   GFR calc Af Amer 76 >59 mL/min/1.73   BUN/Creatinine Ratio 10 9 - 23   Sodium  140 134 - 144 mmol/L   Potassium 3.9 3.5 - 5.2 mmol/L   Chloride 103 97 - 108 mmol/L   CO2 23 18 - 29 mmol/L   Calcium 9.2 8.7 - 10.2 mg/dL   Total Protein 6.9 6.0 - 8.5 g/dL   Albumin 4.0 3.5 - 5.5 g/dL   Globulin, Total 2.9 1.5 - 4.5 g/dL   Albumin/Globulin Ratio 1.4 1.1 - 2.5   Bilirubin Total 0.4 0.0 - 1.2 mg/dL   Alkaline Phosphatase 75 39 - 117 IU/L   AST 20 0 - 40 IU/L   ALT 18 0 - 32 IU/L  Lipid panel  Result Value Ref Range   Cholesterol, Total 202 (H) 100 - 199 mg/dL   Triglycerides 82 0 - 149 mg/dL   HDL 47 >17 mg/dL   VLDL Cholesterol Cal 16 5 - 40 mg/dL   LDL Calculated 494 (H) 0 - 99 mg/dL   Chol/HDL Ratio 4.3 0.0 - 4.4 ratio units  CBC with Differential/Platelet  Result Value Ref Range   WBC 6.0 3.4 - 10.8 x10E3/uL   RBC 4.23 3.77 - 5.28 x10E6/uL   Hemoglobin 13.2  11.1 - 15.9 g/dL   Hematocrit 49.6 75.9 - 46.6 %   MCV 93 79 - 97 fL   MCH 31.2 26.6 - 33.0 pg   MCHC 33.4 31.5 - 35.7 g/dL   RDW 16.3 84.6 - 65.9 %   Platelets 216 150 - 379 x10E3/uL   Neutrophils 50 %   Lymphs 38 %   Monocytes 8 %   Eos 4 %   Basos 0 %   Neutrophils Absolute 3.0 1.4 - 7.0 x10E3/uL   Lymphocytes Absolute 2.3 0.7 - 3.1 x10E3/uL   Monocytes Absolute 0.5 0.1 - 0.9 x10E3/uL   EOS (ABSOLUTE) 0.2 0.0 - 0.4 x10E3/uL   Basophils Absolute 0.0 0.0 - 0.2 x10E3/uL   Immature Granulocytes 0 %   Immature Grans (Abs) 0.0 0.0 - 0.1 x10E3/uL  Urinalysis, Routine w reflex microscopic (not at [facility])  Result Value Ref Range   Specific Gravity, UA 1.005 1.005 - 1.030   pH, UA 5.5 5.0 - 7.5   Color, UA Yellow Yellow   Appearance Ur Clear Clear   Leukocytes, UA 2+ (A) Negative   Protein, UA Negative Negative/Trace   Glucose, UA Negative Negative   Ketones, UA Negative Negative   RBC, UA 1+ (A) Negative   Bilirubin, UA Negative Negative   Urobilinogen, Ur 0.2 0.2 - 1.0 mg/dL   Nitrite, UA Negative Negative   Microscopic Examination See below:   TSH  Result Value Ref Range   TSH 1.360 0.450 - 4.500 uIU/mL  Pregnancy, urine  Result Value Ref Range   Preg Test, Ur Negative Negative      Assessment & Plan:   Problem List Items Addressed This Visit      Unprioritized   Myofacial muscle pain   Relevant Medications   naproxen (NAPROSYN) 500 MG tablet   Neck pain - Primary      Rx for Naprosyn 500 mg BID.  Muscle relaxer at night.   Follow up plan: Return for Tried to get appt with Dr. Laural Benes who is unavailable this week.  Referred to Dr. Jeannetta Ellis .

## 2015-07-06 ENCOUNTER — Telehealth: Payer: Self-pay

## 2015-07-06 ENCOUNTER — Ambulatory Visit (INDEPENDENT_AMBULATORY_CARE_PROVIDER_SITE_OTHER): Payer: 59

## 2015-07-06 DIAGNOSIS — Z3042 Encounter for surveillance of injectable contraceptive: Secondary | ICD-10-CM | POA: Diagnosis not present

## 2015-07-06 MED ORDER — MEDROXYPROGESTERONE ACETATE 150 MG/ML IM SUSP
150.0000 mg | Freq: Once | INTRAMUSCULAR | Status: AC
  Administered 2015-07-06: 150 mg via INTRAMUSCULAR

## 2015-07-06 NOTE — Telephone Encounter (Signed)
She needs new orders placed for her depo. She had her CPE in September.

## 2015-07-07 MED ORDER — MEDROXYPROGESTERONE ACETATE 150 MG/ML IM SUSP: 150.0000 mg | Freq: Once | INTRAMUSCULAR | Status: DC

## 2015-07-07 NOTE — Telephone Encounter (Signed)
Done    Elby Showers, CMA at 07/06/2015 3:28 PM     Status: Signed       Expand All Collapse All   She needs new orders placed for her depo. She had her CPE in September.

## 2015-07-08 ENCOUNTER — Ambulatory Visit: Payer: 59

## 2015-11-02 ENCOUNTER — Encounter: Payer: 59 | Admitting: Family Medicine

## 2015-11-15 ENCOUNTER — Encounter: Payer: 59 | Admitting: Family Medicine

## 2016-01-13 DIAGNOSIS — Z79899 Other long term (current) drug therapy: Secondary | ICD-10-CM | POA: Insufficient documentation

## 2016-06-07 ENCOUNTER — Ambulatory Visit (INDEPENDENT_AMBULATORY_CARE_PROVIDER_SITE_OTHER): Payer: 59 | Admitting: Family Medicine

## 2016-06-07 ENCOUNTER — Other Ambulatory Visit: Payer: Self-pay | Admitting: Family Medicine

## 2016-06-07 ENCOUNTER — Encounter: Payer: Self-pay | Admitting: Family Medicine

## 2016-06-07 VITALS — BP 123/84 | HR 79 | Ht 63.39 in | Wt 214.0 lb

## 2016-06-07 DIAGNOSIS — Z1329 Encounter for screening for other suspected endocrine disorder: Secondary | ICD-10-CM | POA: Diagnosis not present

## 2016-06-07 DIAGNOSIS — Z1159 Encounter for screening for other viral diseases: Secondary | ICD-10-CM

## 2016-06-07 DIAGNOSIS — Z13220 Encounter for screening for lipoid disorders: Secondary | ICD-10-CM | POA: Diagnosis not present

## 2016-06-07 DIAGNOSIS — L930 Discoid lupus erythematosus: Secondary | ICD-10-CM | POA: Insufficient documentation

## 2016-06-07 DIAGNOSIS — Z124 Encounter for screening for malignant neoplasm of cervix: Secondary | ICD-10-CM

## 2016-06-07 DIAGNOSIS — Z0000 Encounter for general adult medical examination without abnormal findings: Secondary | ICD-10-CM | POA: Diagnosis not present

## 2016-06-07 DIAGNOSIS — Z114 Encounter for screening for human immunodeficiency virus [HIV]: Secondary | ICD-10-CM | POA: Diagnosis not present

## 2016-06-07 LAB — URINALYSIS, ROUTINE W REFLEX MICROSCOPIC
Bilirubin, UA: NEGATIVE
Glucose, UA: NEGATIVE
Ketones, UA: NEGATIVE
Nitrite, UA: NEGATIVE
Protein, UA: NEGATIVE
Specific Gravity, UA: 1.01 (ref 1.005–1.030)
Urobilinogen, Ur: 0.2 mg/dL (ref 0.2–1.0)
pH, UA: 6 (ref 5.0–7.5)

## 2016-06-07 LAB — MICROSCOPIC EXAMINATION
Bacteria, UA: NONE SEEN
RBC, UA: NONE SEEN /hpf (ref 0–?)

## 2016-06-07 MED ORDER — FLUTICASONE PROPIONATE 50 MCG/ACT NA SUSP: 2.0000 | Freq: Every day | NASAL | 12 refills | Status: DC

## 2016-06-07 MED ORDER — FEXOFENADINE HCL 180 MG PO TABS: 180.0000 mg | ORAL_TABLET | Freq: Every day | ORAL | 12 refills | Status: DC

## 2016-06-07 NOTE — Progress Notes (Signed)
BP 123/84   Pulse 79   Ht 5' 3.39" (1.61 m)   Wt 214 lb (97.1 kg)   SpO2 98%   BMI 37.45 kg/m    Subjective:    Patient ID: Stacey Marshall, female    DOB: 01-Aug-1960, 56 y.o.   MRN: [id number]  HPI: Stacey Marshall is a 56 y.o. female  Chief Complaint  Patient presents with  . 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 During the same. Was having a great deal of problems with trigger fingers and her hands. This has resolved now that she is back on Plaquenil.  Relevant past medical, surgical, family and social history reviewed and updated as indicated. Interim medical history since our last visit reviewed. Allergies and medications reviewed and updated.  Review of Systems  Constitutional: Negative.   HENT: Negative.   Eyes: Negative.   Respiratory: Negative.   Cardiovascular: Negative.   Gastrointestinal: Negative.   Endocrine: Negative.   Genitourinary: Negative.   Musculoskeletal: Negative.   Skin: Negative.   Allergic/Immunologic: Negative.   Neurological: Negative.   Hematological: Negative.   Psychiatric/Behavioral: Negative.     Per HPI unless specifically indicated above     Objective:    BP 123/84   Pulse 79   Ht 5' 3.39" (1.61 m)   Wt 214 lb (97.1 kg)   SpO2 98%   BMI 37.45 kg/m   Wt Readings from Last 3 Encounters:  06/07/16 214 lb (97.1 kg)  06/23/15 209 lb 12.8 oz (95.2 kg)  10/29/14 199 lb (90.3 kg)    Physical Exam  Constitutional: She is oriented to person, place, and time. She appears well-developed and well-nourished.  HENT:  Head: Normocephalic and atraumatic.  Right Ear: External ear normal.  Left Ear: External ear normal.  Nose: Nose normal.  Mouth/Throat: Oropharynx is clear and moist.  Eyes: Conjunctivae and EOM are normal. Pupils are equal, round, and reactive to light.  Neck: Normal range of motion. Neck supple. Carotid bruit is not present.  Cardiovascular: Normal rate, regular rhythm and normal  heart sounds.   No murmur heard. Pulmonary/Chest: Effort normal and breath sounds normal. She exhibits no mass. Right breast exhibits no mass, no skin change and no tenderness. Left breast exhibits no mass, no skin change and no tenderness. Breasts are symmetrical.  Abdominal: Soft. Bowel sounds are normal. There is no hepatosplenomegaly.  Genitourinary: Vagina normal and uterus normal.  Musculoskeletal: Normal range of motion.  Neurological: She is alert and oriented to person, place, and time.  Skin: No rash noted.  Psychiatric: She has a normal mood and affect. Her behavior is normal. Judgment and thought content normal.    Results for orders placed or performed in visit on 10/29/14  Microscopic Examination  Result Value Ref Range   WBC, UA 0-5 0 - 5 /hpf   RBC, UA 0-2 0 - 2 /hpf   Epithelial Cells (non renal) 0-10 0 - 10 /hpf   Renal Epithel, UA None seen None seen /hpf   Crystals Present (A) N/A   Crystal Type Amorphous Sediment N/A   Bacteria, UA Few None seen/Few  Comprehensive metabolic panel  Result Value Ref Range   Glucose 97 65 - 99 mg/dL   BUN 10 6 - 24 mg/dL   Creatinine, Ser 7.09 0.57 - 1.00 mg/dL   GFR calc non Af Amer 66 >59 mL/min/1.73   GFR calc Af Amer 76 >59 mL/min/1.73   BUN/Creatinine Ratio 10 9 -  23   Sodium 140 134 - 144 mmol/L   Potassium 3.9 3.5 - 5.2 mmol/L   Chloride 103 97 - 108 mmol/L   CO2 23 18 - 29 mmol/L   Calcium 9.2 8.7 - 10.2 mg/dL   Total Protein 6.9 6.0 - 8.5 g/dL   Albumin 4.0 3.5 - 5.5 g/dL   Globulin, Total 2.9 1.5 - 4.5 g/dL   Albumin/Globulin Ratio 1.4 1.1 - 2.5   Bilirubin Total 0.4 0.0 - 1.2 mg/dL   Alkaline Phosphatase 75 39 - 117 IU/L   AST 20 0 - 40 IU/L   ALT 18 0 - 32 IU/L  Lipid panel  Result Value Ref Range   Cholesterol, Total 202 (H) 100 - 199 mg/dL   Triglycerides 82 0 - 149 mg/dL   HDL 47 >91 mg/dL   VLDL Cholesterol Cal 16 5 - 40 mg/dL   LDL Calculated 478 (H) 0 - 99 mg/dL   Chol/HDL Ratio 4.3 0.0 - 4.4 ratio  units  CBC with Differential/Platelet  Result Value Ref Range   WBC 6.0 3.4 - 10.8 x10E3/uL   RBC 4.23 3.77 - 5.28 x10E6/uL   Hemoglobin 13.2 11.1 - 15.9 g/dL   Hematocrit 29.5 62.1 - 46.6 %   MCV 93 79 - 97 fL   MCH 31.2 26.6 - 33.0 pg   MCHC 33.4 31.5 - 35.7 g/dL   RDW 30.8 65.7 - 84.6 %   Platelets 216 150 - 379 x10E3/uL   Neutrophils 50 %   Lymphs 38 %   Monocytes 8 %   Eos 4 %   Basos 0 %   Neutrophils Absolute 3.0 1.4 - 7.0 x10E3/uL   Lymphocytes Absolute 2.3 0.7 - 3.1 x10E3/uL   Monocytes Absolute 0.5 0.1 - 0.9 x10E3/uL   EOS (ABSOLUTE) 0.2 0.0 - 0.4 x10E3/uL   Basophils Absolute 0.0 0.0 - 0.2 x10E3/uL   Immature Granulocytes 0 %   Immature Grans (Abs) 0.0 0.0 - 0.1 x10E3/uL  Urinalysis, Routine w reflex microscopic (not at [facility])  Result Value Ref Range   Specific Gravity, UA 1.005 1.005 - 1.030   pH, UA 5.5 5.0 - 7.5   Color, UA Yellow Yellow   Appearance Ur Clear Clear   Leukocytes, UA 2+ (A) Negative   Protein, UA Negative Negative/Trace   Glucose, UA Negative Negative   Ketones, UA Negative Negative   RBC, UA 1+ (A) Negative   Bilirubin, UA Negative Negative   Urobilinogen, Ur 0.2 0.2 - 1.0 mg/dL   Nitrite, UA Negative Negative   Microscopic Examination See below:   TSH  Result Value Ref Range   TSH 1.360 0.450 - 4.500 uIU/mL  Pregnancy, urine  Result Value Ref Range   Preg Test, Ur Negative Negative      Assessment & Plan:   Problem List Items Addressed This Visit      Musculoskeletal and Integument   Discoid lupus    Back on Plaquenil       Other Visit Diagnoses    Annual physical exam    -  Primary   Relevant Orders   CBC with Differential/Platelet   Comprehensive metabolic panel   Lipid panel   TSH   Urinalysis, Routine w reflex microscopic   Screening cholesterol level       Relevant Orders   Lipid panel   Thyroid disorder screen       Relevant Orders   TSH   Cervical cancer screening       Relevant  Orders   IGP, Aptima HPV, rfx  16/18,45   Need for hepatitis C screening test       Relevant Orders   Hepatitis C Antibody   Encounter for screening for HIV       Relevant Orders   HIV antibody (with reflex)      Discuss wt loss and exercise  Follow up plan: Return in about 1 year (around 06/07/2017), or if symptoms worsen or fail to improve.

## 2016-06-07 NOTE — Assessment & Plan Note (Signed)
Back on Plaquenil

## 2016-06-08 ENCOUNTER — Encounter: Payer: Self-pay | Admitting: Family Medicine

## 2016-06-08 LAB — LIPID PANEL
Chol/HDL Ratio: 3.2 ratio (ref 0.0–4.4)
Cholesterol, Total: 168 mg/dL (ref 100–199)
HDL: 53 mg/dL (ref >39)
LDL Calculated: 101 mg/dL — ABNORMAL HIGH (ref 0–99)
Triglycerides: 70 mg/dL (ref 0–149)
VLDL Cholesterol Cal: 14 mg/dL (ref 5–40)

## 2016-06-08 LAB — CBC WITH DIFFERENTIAL/PLATELET
Basophils Absolute: 0 10*3/uL (ref 0.0–0.2)
Basos: 0 %
EOS (ABSOLUTE): 0.2 10*3/uL (ref 0.0–0.4)
Eos: 3 %
Hematocrit: 38.7 % (ref 34.0–46.6)
Hemoglobin: 12.7 g/dL (ref 11.1–15.9)
Immature Grans (Abs): 0 10*3/uL (ref 0.0–0.1)
Immature Granulocytes: 0 %
Lymphocytes Absolute: 2.6 10*3/uL (ref 0.7–3.1)
Lymphs: 35 %
MCH: 30.2 pg (ref 26.6–33.0)
MCHC: 32.8 g/dL (ref 31.5–35.7)
MCV: 92 fL (ref 79–97)
Monocytes Absolute: 0.4 10*3/uL (ref 0.1–0.9)
Monocytes: 5 %
Neutrophils Absolute: 4.2 10*3/uL (ref 1.4–7.0)
Neutrophils: 57 %
Platelets: 200 10*3/uL (ref 150–379)
RBC: 4.21 x10E6/uL (ref 3.77–5.28)
RDW: 14.9 % (ref 12.3–15.4)
WBC: 7.4 10*3/uL (ref 3.4–10.8)

## 2016-06-08 LAB — COMPREHENSIVE METABOLIC PANEL
ALT: 18 IU/L (ref 0–32)
AST: 22 IU/L (ref 0–40)
Albumin/Globulin Ratio: 1.2 (ref 1.2–2.2)
Albumin: 3.8 g/dL (ref 3.5–5.5)
Alkaline Phosphatase: 87 IU/L (ref 39–117)
BUN/Creatinine Ratio: 14 (ref 9–23)
BUN: 12 mg/dL (ref 6–24)
Bilirubin Total: 0.3 mg/dL (ref 0.0–1.2)
CO2: 21 mmol/L (ref 18–29)
Calcium: 9.1 mg/dL (ref 8.7–10.2)
Chloride: 105 mmol/L (ref 96–106)
Creatinine, Ser: 0.87 mg/dL (ref 0.57–1.00)
GFR calc Af Amer: 88 mL/min/{1.73_m2} (ref >59)
GFR calc non Af Amer: 76 mL/min/{1.73_m2} (ref >59)
Globulin, Total: 3.3 g/dL (ref 1.5–4.5)
Glucose: 89 mg/dL (ref 65–99)
Potassium: 4 mmol/L (ref 3.5–5.2)
Sodium: 140 mmol/L (ref 134–144)
Total Protein: 7.1 g/dL (ref 6.0–8.5)

## 2016-06-08 LAB — HEPATITIS C ANTIBODY: Hep C Virus Ab: <0.1 s/co ratio (ref 0.0–0.9)

## 2016-06-08 LAB — TSH: TSH: 1.75 u[IU]/mL (ref 0.450–4.500)

## 2016-06-08 LAB — HIV ANTIBODY (ROUTINE TESTING W REFLEX): HIV Screen 4th Generation wRfx: NONREACTIVE

## 2016-06-13 LAB — IGP, APTIMA HPV, RFX 16/18,45
HPV Aptima: POSITIVE — AB
HPV Genotype 16: NEGATIVE
HPV Genotype 18,45: POSITIVE — AB
PAP Smear Comment: 0

## 2016-07-13 ENCOUNTER — Encounter: Payer: Self-pay | Admitting: Family Medicine

## 2016-07-13 ENCOUNTER — Telehealth: Payer: Self-pay | Admitting: Family Medicine

## 2016-07-13 ENCOUNTER — Ambulatory Visit (INDEPENDENT_AMBULATORY_CARE_PROVIDER_SITE_OTHER): Payer: 59 | Admitting: Family Medicine

## 2016-07-13 VITALS — BP 133/86 | HR 77 | Temp 98.7°F | Wt 211.0 lb

## 2016-07-13 DIAGNOSIS — Z3042 Encounter for surveillance of injectable contraceptive: Secondary | ICD-10-CM | POA: Diagnosis not present

## 2016-07-13 LAB — PREGNANCY, URINE: Preg Test, Ur: NEGATIVE

## 2016-07-13 MED ORDER — MEDROXYPROGESTERONE ACETATE 150 MG/ML IM SUSP
150.0000 mg | Freq: Once | INTRAMUSCULAR | Status: AC
  Administered 2016-07-13: 150 mg via INTRAMUSCULAR

## 2016-07-13 NOTE — Progress Notes (Signed)
   BP 133/86   Pulse 77   Temp 98.7 F (37.1 C)   Wt 211 lb (95.7 kg)   LMP 07/10/2016 (Exact Date)   SpO2 100%   BMI 36.92 kg/m    Subjective:    Patient ID: Stacey Marshall, female    DOB: [birth year], 56 y.o.   MRN: [id number]  HPI: Stacey Marshall is a 56 y.o. female  Chief Complaint  Patient presents with  . Contraception    She would like to restart the Depo   Patient presents to discuss restarting the depo shot. Has been off for about 1 year now thinking she was in menopause but started back having periods about 3-4 weeks ago. Did well on the depo for a long time, no side effects or concerns.   Relevant past medical, surgical, family and social history reviewed and updated as indicated. Interim medical history since our last visit reviewed. Allergies and medications reviewed and updated.  Review of Systems  Constitutional: Negative.   HENT: Negative.   Respiratory: Negative.   Cardiovascular: Negative.   Gastrointestinal: Negative.   Genitourinary: Positive for menstrual problem.  Musculoskeletal: Negative.   Neurological: Negative.   Psychiatric/Behavioral: Negative.    Per HPI unless specifically indicated above     Objective:    BP 133/86   Pulse 77   Temp 98.7 F (37.1 C)   Wt 211 lb (95.7 kg)   LMP 07/10/2016 (Exact Date)   SpO2 100%   BMI 36.92 kg/m   Wt Readings from Last 3 Encounters:  07/13/16 211 lb (95.7 kg)  06/07/16 214 lb (97.1 kg)  06/23/15 209 lb 12.8 oz (95.2 kg)    Physical Exam  Constitutional: She is oriented to person, place, and time. She appears well-developed and well-nourished. No distress.  HENT:  Head: Atraumatic.  Eyes: Conjunctivae are normal. Pupils are equal, round, and reactive to light. No scleral icterus.  Neck: Normal range of motion. Neck supple.  Cardiovascular: Normal rate and normal heart sounds.   Pulmonary/Chest: Effort normal and breath sounds normal. No respiratory distress.  Musculoskeletal:  Normal range of motion.  Lymphadenopathy:    She has no cervical adenopathy.  Neurological: She is alert and oriented to person, place, and time.  Skin: Skin is warm and dry.  Psychiatric: She has a normal mood and affect. Her behavior is normal.  Nursing note and vitals reviewed.     Assessment & Plan:   Problem List Items Addressed This Visit    None    Visit Diagnoses    Family planning, Depo-Provera contraception monitoring/administration    -  Primary   Urine pregnancy negative. Will restart depo injections. F/u in 3 months for next injection   Relevant Orders   Pregnancy, urine       Follow up plan: Return in about 3 months (around 10/13/2016) for Depo.

## 2016-07-13 NOTE — Telephone Encounter (Signed)
Discussed with Dr. Laural Benes, stated pt would need OV to discuss. Pt scheduled with rachel this afternoon. Will route to her as FYI.

## 2016-07-13 NOTE — Telephone Encounter (Signed)
Patient called to see if she could restart her DEPO injections because for the past 2 months she has had a period and has been off the DEPO shots for a year.  Please advise.    Thanks  [phone]

## 2016-08-30 ENCOUNTER — Encounter: Payer: Self-pay | Admitting: Family Medicine

## 2016-08-30 ENCOUNTER — Ambulatory Visit (INDEPENDENT_AMBULATORY_CARE_PROVIDER_SITE_OTHER): Payer: 59 | Admitting: Family Medicine

## 2016-08-30 ENCOUNTER — Telehealth: Payer: Self-pay | Admitting: Family Medicine

## 2016-08-30 VITALS — BP 125/86 | HR 94 | Temp 98.3°F | Wt 206.0 lb

## 2016-08-30 DIAGNOSIS — N920 Excessive and frequent menstruation with regular cycle: Secondary | ICD-10-CM

## 2016-08-30 DIAGNOSIS — N939 Abnormal uterine and vaginal bleeding, unspecified: Secondary | ICD-10-CM

## 2016-08-30 MED ORDER — ESTRADIOL 0.1 MG/GM VA CREA: 1.0000 | TOPICAL_CREAM | Freq: Every day | VAGINAL | 3 refills | Status: DC

## 2016-08-30 NOTE — Telephone Encounter (Signed)
Patient would like to speak with a CMA regarding some questions she has with an issue she has going go at this time.  She did not specify any details but did say she may need an appt?   [phone] Lennis  Thank You

## 2016-08-30 NOTE — Telephone Encounter (Signed)
Pt w/ complaints of vaginal dryness, she used OTC moisturizers for a few weeks, but those cause discharge. Pt also experiencing some bleeding after intercourse. Pt scheduled w/ Fleet Contras for 2:15 today.

## 2016-08-30 NOTE — Patient Instructions (Signed)
Hyaluronic Acid

## 2016-08-30 NOTE — Progress Notes (Signed)
BP 125/86   Pulse 94   Temp 98.3 F (36.8 C)   Wt 206 lb (93.4 kg)   SpO2 100%   BMI 36.05 kg/m    Subjective:    Patient ID: Stacey Marshall, female    DOB: 08/26/1960, 56 y.o.   MRN: [id number]  HPI: Lizvet Chunn is a 56 y.o. female  Chief Complaint  Patient presents with  . Vaginal Dryness    tried using the replense to see if that helped and wondered if this was why she was having the bleeding.  . Vaginal Bleeding    started about 6 weeks ago after intercourse, tried the replense. Then last week she started having a period again, then off and on spotting. No pain.   Patient presents today to spotting after intercourse. Notes sxs the last few months, with some improvement using vaginal moisturizers. Friends have told her this is due to changes during menopause. Denies pain, cramping, N/V, fatigue.   Also having some irregular bleeding that she isn't sure if she should attribute to peri-menopause or not. Recently had the depo shot when she was told by a friend that there is still a chance of pregnancy until full menopause. Previously had no periods when on depo shot long term.   Relevant past medical, surgical, family and social history reviewed and updated as indicated. Interim medical history since our last visit reviewed. Allergies and medications reviewed and updated.  Review of Systems  Constitutional: Negative.   Respiratory: Negative.   Cardiovascular: Negative.   Gastrointestinal: Negative.   Genitourinary: Positive for menstrual problem.  Musculoskeletal: Negative.   Neurological: Negative.   Psychiatric/Behavioral: Negative.    Per HPI unless specifically indicated above     Objective:    BP 125/86   Pulse 94   Temp 98.3 F (36.8 C)   Wt 206 lb (93.4 kg)   SpO2 100%   BMI 36.05 kg/m   Wt Readings from Last 3 Encounters:  08/30/16 206 lb (93.4 kg)  07/13/16 211 lb (95.7 kg)  06/07/16 214 lb (97.1 kg)    Physical Exam  Constitutional:  She is oriented to person, place, and time. She appears well-developed and well-nourished.  HENT:  Head: Atraumatic.  Eyes: Pupils are equal, round, and reactive to light. Conjunctivae are normal.  Neck: Normal range of motion. Neck supple.  Cardiovascular: Normal rate and normal heart sounds.   Pulmonary/Chest: Effort normal and breath sounds normal. No respiratory distress.  Musculoskeletal: Normal range of motion.  Neurological: She is alert and oriented to person, place, and time.  Skin: Skin is warm and dry.  Psychiatric: She has a normal mood and affect. Her behavior is normal.  Nursing note and vitals reviewed.  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 Start estrace cream, continue moisturizers. F/u if no improvement.     Will check in on hormone levels when due for next depo injection to see where things are at with menopause. Unclear if her irregular bleeding is perimenopausal or otherwise. Continue to monitor closely   Follow up plan: Return in about 4 weeks (around 09/27/2016) for Depo, hormone check.

## 2016-09-06 ENCOUNTER — Telehealth: Payer: Self-pay

## 2016-09-06 NOTE — Telephone Encounter (Signed)
Pharmacy wants to know how many grams of the Estradiol should she be inserting every night?

## 2016-09-07 NOTE — Telephone Encounter (Signed)
Pharmacy notified.

## 2016-09-07 NOTE — Telephone Encounter (Signed)
1 gram nightly

## 2016-10-02 ENCOUNTER — Ambulatory Visit (INDEPENDENT_AMBULATORY_CARE_PROVIDER_SITE_OTHER): Payer: 59 | Admitting: Family Medicine

## 2016-10-02 ENCOUNTER — Encounter: Payer: Self-pay | Admitting: Family Medicine

## 2016-10-02 VITALS — BP 123/83 | HR 81 | Temp 98.3°F | Wt 200.0 lb

## 2016-10-02 DIAGNOSIS — N939 Abnormal uterine and vaginal bleeding, unspecified: Secondary | ICD-10-CM

## 2016-10-02 DIAGNOSIS — N920 Excessive and frequent menstruation with regular cycle: Secondary | ICD-10-CM | POA: Diagnosis not present

## 2016-10-02 DIAGNOSIS — Z3042 Encounter for surveillance of injectable contraceptive: Secondary | ICD-10-CM

## 2016-10-02 MED ORDER — MEDROXYPROGESTERONE ACETATE 150 MG/ML IM SUSP
150.0000 mg | Freq: Once | INTRAMUSCULAR | Status: AC
  Administered 2016-10-02: 150 mg via INTRAMUSCULAR

## 2016-10-02 MED ORDER — ESTRADIOL 0.1 MG/GM VA CREA: 1.0000 | TOPICAL_CREAM | Freq: Every evening | VAGINAL | 3 refills | Status: DC | PRN

## 2016-10-02 NOTE — Patient Instructions (Addendum)
Follow up as needed   Atrophic Vaginitis Atrophic vaginitis is when the tissues that line the vagina become dry and thin. This is caused by a drop in estrogen. Estrogen helps:  To keep the vagina moist.  To make a clear fluid that helps: ? To lubricate the vagina for sex. ? To protect the vagina from infection.  If the lining of the vagina is dry and thin, it may:  Make sex painful. It may also cause bleeding.  Cause a feeling of: ? Burning. ? Irritation. ? Itchiness.  Make an exam of your vagina painful. It may also cause bleeding.  Make you lose interest in sex.  Cause a burning feeling when you pee.  Make your vaginal fluid (discharge) brown or yellow.  For some women, there are no symptoms. This condition is most common in women who do not get their regular menstrual periods anymore (menopause). This often starts when a woman is 33-79 years old. Follow these instructions at home:  Take medicines only as told by your doctor. Do not use any herbal or alternative medicines unless your doctor says it is okay.  Use over-the-counter products for dryness only as told by your doctor. These include: ? Creams. ? Lubricants. ? Moisturizers.  Do not douche.  Do not use products that can make your vagina dry. These include: ? Scented feminine sprays. ? Scented tampons. ? Scented soaps.  If it hurts to have sex, tell your sexual partner. Contact a doctor if:  Your discharge looks different than normal.  Your vagina has an unusual smell.  You have new symptoms.  Your symptoms do not get better with treatment.  Your symptoms get worse. This information is not intended to replace advice given to you by your health care provider. Make sure you discuss any questions you have with your health care provider. Document Released: 07/12/2007 Document Revised: 07/01/2015 Document Reviewed: 01/14/2014 Elsevier Interactive Patient Education  [employer].

## 2016-10-02 NOTE — Progress Notes (Signed)
   BP 123/83   Pulse 81   Temp 98.3 F (36.8 C)   Wt 200 lb (90.7 kg)   SpO2 97%   BMI 35.00 kg/m    Subjective:    Patient ID: Stacey Marshall, female    DOB: 1960-07-01, 56 y.o.   MRN: [id number]  HPI: Stacey Marshall is a 56 y.o. female  Chief Complaint  Patient presents with  . Vaginal dryness    states that is doing much better now  . Contraception    due for depo today, needs a new standing order   Patient presents for 1 month f/u after starting the estrace cream. Using about twice weekly at bedtime and states the spotting after intercourse and irregular bleeding has completely ceased. Using hyaluronic acid as well for moisturizing. No noted side effects with the medication. No concerns with the depo, wanting to continue.   Relevant past medical, surgical, family and social history reviewed and updated as indicated. Interim medical history since our last visit reviewed. Allergies and medications reviewed and updated.  Review of Systems  Constitutional: Negative.   Respiratory: Negative.   Cardiovascular: Negative.   Gastrointestinal: Negative.   Genitourinary: Negative.   Musculoskeletal: Negative.   Neurological: Negative.   Psychiatric/Behavioral: Negative.     Per HPI unless specifically indicated above     Objective:    BP 123/83   Pulse 81   Temp 98.3 F (36.8 C)   Wt 200 lb (90.7 kg)   SpO2 97%   BMI 35.00 kg/m   Wt Readings from Last 3 Encounters:  10/02/16 200 lb (90.7 kg)  08/30/16 206 lb (93.4 kg)  07/13/16 211 lb (95.7 kg)    Physical Exam  Constitutional: She is oriented to person, place, and time. She appears well-developed and well-nourished. No distress.  HENT:  Head: Atraumatic.  Eyes: Pupils are equal, round, and reactive to light. Conjunctivae are normal.  Cardiovascular: Normal rate.   Pulmonary/Chest: Effort normal. No respiratory distress.  Musculoskeletal: Normal range of motion.  Neurological: She is alert and  oriented to person, place, and time.  Skin: Skin is warm and dry.  Psychiatric: She has a normal mood and affect. Her behavior is normal.  Nursing note and vitals reviewed.     Assessment & Plan:   Problem List Items Addressed This Visit    None    Visit Diagnoses    Vaginal spotting    -  Primary   Doing very well on estrace cream and hyaluronic acid, continue prn use.   Family planning, Depo-Provera contraception monitoring/administration       Depo given today   Relevant Medications   medroxyPROGESTERone (DEPO-PROVERA) injection 150 mg (Completed)       Follow up plan: Return for as scheduled for CPE.

## 2016-11-22 ENCOUNTER — Telehealth: Payer: Self-pay | Admitting: Family Medicine

## 2016-11-22 MED ORDER — ESTRADIOL 0.1 MG/GM VA CREA: 1.0000 | TOPICAL_CREAM | Freq: Every evening | VAGINAL | 3 refills | Status: DC | PRN

## 2016-11-22 NOTE — Telephone Encounter (Signed)
RX sent

## 2016-11-22 NOTE — Telephone Encounter (Signed)
Patient calling in regards to a fax from [facility] to fill her prescription for estradiol.  Please Advise. Thank you

## 2016-11-22 NOTE — Telephone Encounter (Signed)
Routing to provider. She needs new rx sent to mail order.

## 2016-12-27 ENCOUNTER — Ambulatory Visit (INDEPENDENT_AMBULATORY_CARE_PROVIDER_SITE_OTHER): Payer: 59

## 2016-12-27 DIAGNOSIS — Z3042 Encounter for surveillance of injectable contraceptive: Secondary | ICD-10-CM

## 2016-12-27 MED ORDER — MEDROXYPROGESTERONE ACETATE 150 MG/ML IM SUSP: 150.0000 mg | Freq: Once | INTRAMUSCULAR | Status: DC

## 2016-12-27 MED ORDER — MEDROXYPROGESTERONE ACETATE 150 MG/ML IM SUSP
150.0000 mg | Freq: Once | INTRAMUSCULAR | Status: AC
  Administered 2016-12-27: 150 mg via INTRAMUSCULAR

## 2017-03-19 ENCOUNTER — Ambulatory Visit (INDEPENDENT_AMBULATORY_CARE_PROVIDER_SITE_OTHER): Payer: Managed Care, Other (non HMO)

## 2017-03-19 DIAGNOSIS — Z3042 Encounter for surveillance of injectable contraceptive: Secondary | ICD-10-CM | POA: Diagnosis not present

## 2017-03-19 MED ORDER — MEDROXYPROGESTERONE ACETATE 150 MG/ML IM SUSP
150.0000 mg | INTRAMUSCULAR | Status: AC
  Administered 2017-03-19 – 2018-10-10 (×5): 150 mg via INTRAMUSCULAR

## 2017-03-19 MED ORDER — MEDROXYPROGESTERONE ACETATE 150 MG/ML IM SUSP: 150.0000 mg | Freq: Once | INTRAMUSCULAR | Status: DC

## 2017-06-11 ENCOUNTER — Ambulatory Visit (INDEPENDENT_AMBULATORY_CARE_PROVIDER_SITE_OTHER): Payer: Managed Care, Other (non HMO) | Admitting: Family Medicine

## 2017-06-11 ENCOUNTER — Telehealth: Payer: Self-pay | Admitting: Family Medicine

## 2017-06-11 ENCOUNTER — Encounter: Payer: Self-pay | Admitting: Family Medicine

## 2017-06-11 VITALS — BP 130/83 | HR 76 | Ht 61.81 in | Wt 203.0 lb

## 2017-06-11 DIAGNOSIS — Z23 Encounter for immunization: Secondary | ICD-10-CM

## 2017-06-11 DIAGNOSIS — Z13220 Encounter for screening for lipoid disorders: Secondary | ICD-10-CM | POA: Diagnosis not present

## 2017-06-11 DIAGNOSIS — Z1329 Encounter for screening for other suspected endocrine disorder: Secondary | ICD-10-CM | POA: Diagnosis not present

## 2017-06-11 DIAGNOSIS — Z1239 Encounter for other screening for malignant neoplasm of breast: Secondary | ICD-10-CM

## 2017-06-11 DIAGNOSIS — L930 Discoid lupus erythematosus: Secondary | ICD-10-CM | POA: Diagnosis not present

## 2017-06-11 DIAGNOSIS — Z0000 Encounter for general adult medical examination without abnormal findings: Secondary | ICD-10-CM | POA: Diagnosis not present

## 2017-06-11 LAB — MICROSCOPIC EXAMINATION

## 2017-06-11 LAB — URINALYSIS, ROUTINE W REFLEX MICROSCOPIC
Bilirubin, UA: NEGATIVE
Glucose, UA: NEGATIVE
Ketones, UA: NEGATIVE
Nitrite, UA: NEGATIVE
Protein, UA: NEGATIVE
Specific Gravity, UA: 1.015 (ref 1.005–1.030)
Urobilinogen, Ur: 0.2 mg/dL (ref 0.2–1.0)
pH, UA: 7 (ref 5.0–7.5)

## 2017-06-11 NOTE — Assessment & Plan Note (Signed)
Patient has not been back to rheumatology for some time encouraged follow-up again

## 2017-06-11 NOTE — Progress Notes (Signed)
BP 130/83   Pulse 76   Ht 5' 1.81" (1.57 m)   Wt 203 lb (92.1 kg)   SpO2 98%   BMI 37.36 kg/m    Subjective:    Patient ID: Stacey Marshall, female    DOB: October 12, 1960, 57 y.o.   MRN: [id number]  HPI: Stacey Marshall is a 57 y.o. female  Chief Complaint  Patient presents with  . Annual Exam  Patient all in all doing well gets out some run in [place] but limited exercise and trouble with weight loss. Has discoid lupus followed by dermatology and rheumatology.  Has not been rheumatology for over a year. For vaginal dryness was placed on Estrace vaginal cream which is helped uses as needed uses Depo-Provera for periods.  Patient's turning 57 this year on review patient's mother also had periods in her mid 33s.   Relevant past medical, surgical, family and social history reviewed and updated as indicated. Interim medical history since our last visit reviewed. Allergies and medications reviewed and updated.  Review of Systems  Constitutional: Negative.   HENT: Negative.   Eyes: Negative.   Respiratory: Negative.   Cardiovascular: Negative.   Gastrointestinal: Negative.   Endocrine: Negative.   Genitourinary: Negative.   Musculoskeletal: Negative.   Skin: Negative.   Allergic/Immunologic: Negative.   Neurological: Negative.   Hematological: Negative.   Psychiatric/Behavioral: Negative.     Per HPI unless specifically indicated above     Objective:    BP 130/83   Pulse 76   Ht 5' 1.81" (1.57 m)   Wt 203 lb (92.1 kg)   SpO2 98%   BMI 37.36 kg/m   Wt Readings from Last 3 Encounters:  06/11/17 203 lb (92.1 kg)  10/02/16 200 lb (90.7 kg)  08/30/16 206 lb (93.4 kg)    Physical Exam  Constitutional: She is oriented to person, place, and time. She appears well-developed and well-nourished.  HENT:  Head: Normocephalic and atraumatic.  Right Ear: External ear normal.  Left Ear: External ear normal.  Nose: Nose normal.  Mouth/Throat: Oropharynx is clear and  moist.  Eyes: Pupils are equal, round, and reactive to light. Conjunctivae and EOM are normal.  Neck: Normal range of motion. Neck supple. Carotid bruit is not present.  Cardiovascular: Normal rate, regular rhythm and normal heart sounds.  No murmur heard. Pulmonary/Chest: Effort normal and breath sounds normal. She exhibits no mass. Right breast exhibits no mass, no skin change and no tenderness. Left breast exhibits no mass, no skin change and no tenderness. Breasts are symmetrical.  Abdominal: Soft. Bowel sounds are normal. There is no hepatosplenomegaly.  Musculoskeletal: Normal range of motion.  Neurological: She is alert and oriented to person, place, and time.  Skin: No rash noted.  Psychiatric: She has a normal mood and affect. Her behavior is normal. Judgment and thought content normal.    Results for orders placed or performed in visit on 07/13/16  Pregnancy, urine  Result Value Ref Range   Preg Test, Ur Negative Negative      Assessment & Plan:   Problem List Items Addressed This Visit      Musculoskeletal and Integument   Discoid lupus    Patient has not been back to rheumatology for some time encouraged follow-up again       Other Visit Diagnoses    Annual physical exam    -  Primary   Relevant Orders   CBC with Differential/Platelet   Comprehensive metabolic panel  Lipid panel   TSH   Urinalysis, Routine w reflex microscopic   Screening cholesterol level       Relevant Orders   Lipid panel   Thyroid disorder screen       Relevant Orders   TSH   Need for Tdap vaccination       Relevant Orders   Td : Tetanus/diphtheria >7yo Preservative  free (Completed)    Atrophic vaginitis discuss use of Estrace vaginal cream. Perimenopausal discussed menopause use of Depo-Provera patient will use for another year or so as her mom was 82 when she went into menopause.  Follow up plan: Return in about 1 year (around 06/12/2018) for Physical Exam, patient will continue  Depo 3 more shots.[name]

## 2017-06-11 NOTE — Telephone Encounter (Signed)
Copied from CRM [id number]. Topic: Referral - Request >> Jun 11, 2017 10:33 AM Stacey Marshall, Rosey Bath D wrote: Reason for CRM: Patient would like a referral to have her mammogram done soon. Please call patient back, thanks.

## 2017-06-11 NOTE — Telephone Encounter (Signed)
Number to schedule given to patient.

## 2017-06-11 NOTE — Patient Instructions (Signed)
Td Vaccine (Tetanus and Diphtheria): What You Need to Know 1. Why get vaccinated? Tetanus  and diphtheria are very serious diseases. They are rare in the [place] today, but people who do become infected often have severe complications. Td vaccine is used to protect adolescents and adults from both of these diseases. Both tetanus and diphtheria are infections caused by bacteria. Diphtheria spreads from person to person through coughing or sneezing. Tetanus-causing bacteria enter the body through cuts, scratches, or wounds. TETANUS (lockjaw) causes painful muscle tightening and stiffness, usually all over the body.  It can lead to tightening of muscles in the head and neck so you can't open your mouth, swallow, or sometimes even breathe. Tetanus kills about 1 out of every 10 people who are infected even after receiving the best medical care.  DIPHTHERIA can cause a thick coating to form in the back of the throat.  It can lead to breathing problems, paralysis, heart failure, and death.  Before vaccines, as many as 200,000 cases of diphtheria and hundreds of cases of tetanus were reported in the [place] each year. Since vaccination began, reports of cases for both diseases have dropped by about 99%. 2. Td vaccine Td vaccine can protect adolescents and adults from tetanus and diphtheria. Td is usually given as a booster dose every 10 years but it can also be given earlier after a severe and dirty wound or burn. Another vaccine, called Tdap, which protects against pertussis in addition to tetanus and diphtheria, is sometimes recommended instead of Td vaccine. Your doctor or the person giving you the vaccine can give you more information. Td may safely be given at the same time as other vaccines. 3. Some people should not get this vaccine  A person who has ever had a life-threatening allergic reaction after a previous dose of any tetanus or diphtheria containing vaccine, OR has a severe  allergy to any part of this vaccine, should not get Td vaccine. Tell the person giving the vaccine about any severe allergies.  Talk to your doctor if you: ? had severe pain or swelling after any vaccine containing diphtheria or tetanus, ? ever had a condition called Guillain Barre Syndrome (GBS), ? aren't feeling well on the day the shot is scheduled. 4. What are the risks from Td vaccine? With any medicine, including vaccines, there is a chance of side effects. These are usually mild and go away on their own. Serious reactions are also possible but are rare. Most people who get Td vaccine do not have any problems with it. Mild problems following Td vaccine: (Did not interfere with activities)  Pain where the shot was given (about 8 people in 10)  Redness or swelling where the shot was given (about 1 person in 4)  Mild fever (rare)  Headache (about 1 person in 4)  Tiredness (about 1 person in 4)  Moderate problems following Td vaccine: (Interfered with activities, but did not require medical attention)  Fever over 102F (rare)  Severe problems following Td vaccine: (Unable to perform usual activities; required medical attention)  Swelling, severe pain, bleeding and/or redness in the arm where the shot was given (rare).  Problems that could happen after any vaccine:  People sometimes faint after a medical procedure, including vaccination. Sitting or lying down for about 15 minutes can help prevent fainting, and injuries caused by a fall. Tell your doctor if you feel dizzy, or have vision changes or ringing in the ears.  Some people get   severe pain in the shoulder and have difficulty moving the arm where a shot was given. This happens very rarely.  Any medication can cause a severe allergic reaction. Such reactions from a vaccine are very rare, estimated at fewer than 1 in a million doses, and would happen within a few minutes to a few hours after the vaccination. As with any  medicine, there is a very remote chance of a vaccine causing a serious injury or death. The safety of vaccines is always being monitored. For more information, visit: www.cdc.gov/vaccinesafety/ 5. What if there is a serious reaction? What should I look for? Look for anything that concerns you, such as signs of a severe allergic reaction, very high fever, or unusual behavior. Signs of a severe allergic reaction can include hives, swelling of the face and throat, difficulty breathing, a fast heartbeat, dizziness, and weakness. These would usually start a few minutes to a few hours after the vaccination. What should I do?  If you think it is a severe allergic reaction or other emergency that can't wait, call 9-1-1 or get the person to the nearest hospital. Otherwise, call your doctor.  Afterward, the reaction should be reported to the Vaccine Adverse Event Reporting System (VAERS). Your doctor might file this report, or you can do it yourself through the VAERS web site at www.vaers.hhs.gov, or by calling 1-800-822-7967. ? VAERS does not give medical advice. 6. The National Vaccine Injury Compensation Program The National Vaccine Injury Compensation Program (VICP) is a federal program that was created to compensate people who may have been injured by certain vaccines. Persons who believe they may have been injured by a vaccine can learn about the program and about filing a claim by calling 1-800-338-2382 or visiting the VICP website at www.hrsa.gov/vaccinecompensation. There is a time limit to file a claim for compensation. 7. How can I learn more?  Ask your doctor. He or she can give you the vaccine package insert or suggest other sources of information.  Call your local or state health department.  Contact the Centers for Disease Control and Prevention (CDC): ? Call 1-800-232-4636 (1-800-CDC-INFO) ? Visit CDC's website at www.cdc.gov/vaccines CDC Td Vaccine VIS (05/18/15) This information is  not intended to replace advice given to you by your health care provider. Make sure you discuss any questions you have with your health care provider. Document Released: 11/20/2005 Document Revised: 10/14/2015 Document Reviewed: 10/14/2015 Elsevier Interactive Patient Education  2017 [employer] Inc.  

## 2017-06-12 ENCOUNTER — Encounter: Payer: Self-pay | Admitting: Family Medicine

## 2017-06-12 LAB — CBC WITH DIFFERENTIAL/PLATELET
Basophils Absolute: 0 10*3/uL (ref 0.0–0.2)
Basos: 0 %
EOS (ABSOLUTE): 0.2 10*3/uL (ref 0.0–0.4)
Eos: 3 %
Hematocrit: 39.8 % (ref 34.0–46.6)
Hemoglobin: 13.7 g/dL (ref 11.1–15.9)
Immature Grans (Abs): 0 10*3/uL (ref 0.0–0.1)
Immature Granulocytes: 0 %
Lymphocytes Absolute: 2.9 10*3/uL (ref 0.7–3.1)
Lymphs: 43 %
MCH: 31.9 pg (ref 26.6–33.0)
MCHC: 34.4 g/dL (ref 31.5–35.7)
MCV: 93 fL (ref 79–97)
Monocytes Absolute: 0.4 10*3/uL (ref 0.1–0.9)
Monocytes: 6 %
Neutrophils Absolute: 3.2 10*3/uL (ref 1.4–7.0)
Neutrophils: 48 %
Platelets: 201 10*3/uL (ref 150–379)
RBC: 4.29 x10E6/uL (ref 3.77–5.28)
RDW: 14.5 % (ref 12.3–15.4)
WBC: 6.8 10*3/uL (ref 3.4–10.8)

## 2017-06-12 LAB — COMPREHENSIVE METABOLIC PANEL
ALT: 21 IU/L (ref 0–32)
AST: 27 IU/L (ref 0–40)
Albumin/Globulin Ratio: 1.2 (ref 1.2–2.2)
Albumin: 4.2 g/dL (ref 3.5–5.5)
Alkaline Phosphatase: 69 IU/L (ref 39–117)
BUN/Creatinine Ratio: 14 (ref 9–23)
BUN: 12 mg/dL (ref 6–24)
Bilirubin Total: 0.4 mg/dL (ref 0.0–1.2)
CO2: 19 mmol/L — ABNORMAL LOW (ref 20–29)
Calcium: 9.5 mg/dL (ref 8.7–10.2)
Chloride: 106 mmol/L (ref 96–106)
Creatinine, Ser: 0.88 mg/dL (ref 0.57–1.00)
GFR calc Af Amer: 86 mL/min/{1.73_m2} (ref >59)
GFR calc non Af Amer: 75 mL/min/{1.73_m2} (ref >59)
Globulin, Total: 3.5 g/dL (ref 1.5–4.5)
Glucose: 91 mg/dL (ref 65–99)
Potassium: 4.3 mmol/L (ref 3.5–5.2)
Sodium: 141 mmol/L (ref 134–144)
Total Protein: 7.7 g/dL (ref 6.0–8.5)

## 2017-06-12 LAB — LIPID PANEL
Chol/HDL Ratio: 3.2 ratio (ref 0.0–4.4)
Cholesterol, Total: 202 mg/dL — ABNORMAL HIGH (ref 100–199)
HDL: 64 mg/dL (ref >39)
LDL Calculated: 127 mg/dL — ABNORMAL HIGH (ref 0–99)
Triglycerides: 56 mg/dL (ref 0–149)
VLDL Cholesterol Cal: 11 mg/dL (ref 5–40)

## 2017-06-12 LAB — TSH: TSH: 1.09 u[IU]/mL (ref 0.450–4.500)

## 2017-06-18 LAB — HM MAMMOGRAPHY

## 2017-08-29 ENCOUNTER — Encounter: Payer: Self-pay | Admitting: Family Medicine

## 2017-09-03 ENCOUNTER — Ambulatory Visit (INDEPENDENT_AMBULATORY_CARE_PROVIDER_SITE_OTHER): Payer: Managed Care, Other (non HMO)

## 2017-09-03 DIAGNOSIS — Z3042 Encounter for surveillance of injectable contraceptive: Secondary | ICD-10-CM

## 2017-09-03 LAB — PREGNANCY, URINE: Preg Test, Ur: NEGATIVE

## 2017-11-26 ENCOUNTER — Ambulatory Visit (INDEPENDENT_AMBULATORY_CARE_PROVIDER_SITE_OTHER): Payer: Managed Care, Other (non HMO)

## 2017-11-26 DIAGNOSIS — Z3042 Encounter for surveillance of injectable contraceptive: Secondary | ICD-10-CM

## 2018-02-04 ENCOUNTER — Encounter: Payer: Self-pay | Admitting: Family Medicine

## 2018-02-11 ENCOUNTER — Ambulatory Visit (INDEPENDENT_AMBULATORY_CARE_PROVIDER_SITE_OTHER): Payer: Managed Care, Other (non HMO)

## 2018-02-11 DIAGNOSIS — Z3042 Encounter for surveillance of injectable contraceptive: Secondary | ICD-10-CM | POA: Diagnosis not present

## 2018-05-01 ENCOUNTER — Ambulatory Visit (INDEPENDENT_AMBULATORY_CARE_PROVIDER_SITE_OTHER): Payer: Managed Care, Other (non HMO)

## 2018-05-01 ENCOUNTER — Other Ambulatory Visit: Payer: Self-pay

## 2018-05-01 DIAGNOSIS — Z3042 Encounter for surveillance of injectable contraceptive: Secondary | ICD-10-CM

## 2018-05-01 DIAGNOSIS — L930 Discoid lupus erythematosus: Secondary | ICD-10-CM

## 2018-06-17 ENCOUNTER — Encounter: Payer: Managed Care, Other (non HMO) | Admitting: Family Medicine

## 2018-07-19 ENCOUNTER — Ambulatory Visit (INDEPENDENT_AMBULATORY_CARE_PROVIDER_SITE_OTHER): Payer: Managed Care, Other (non HMO) | Admitting: Family Medicine

## 2018-07-19 ENCOUNTER — Encounter: Payer: Self-pay | Admitting: Family Medicine

## 2018-07-19 ENCOUNTER — Other Ambulatory Visit: Payer: Self-pay

## 2018-07-19 VITALS — BP 132/84 | HR 84 | Temp 98.5°F | Ht 61.2 in | Wt 222.8 lb

## 2018-07-19 DIAGNOSIS — J3089 Other allergic rhinitis: Secondary | ICD-10-CM

## 2018-07-19 DIAGNOSIS — Z30013 Encounter for initial prescription of injectable contraceptive: Secondary | ICD-10-CM | POA: Diagnosis not present

## 2018-07-19 DIAGNOSIS — Z1211 Encounter for screening for malignant neoplasm of colon: Secondary | ICD-10-CM

## 2018-07-19 DIAGNOSIS — Z6841 Body Mass Index (BMI) 40.0 and over, adult: Secondary | ICD-10-CM

## 2018-07-19 DIAGNOSIS — L930 Discoid lupus erythematosus: Secondary | ICD-10-CM | POA: Diagnosis not present

## 2018-07-19 DIAGNOSIS — Z1239 Encounter for other screening for malignant neoplasm of breast: Secondary | ICD-10-CM | POA: Diagnosis not present

## 2018-07-19 DIAGNOSIS — N898 Other specified noninflammatory disorders of vagina: Secondary | ICD-10-CM

## 2018-07-19 DIAGNOSIS — Z0000 Encounter for general adult medical examination without abnormal findings: Secondary | ICD-10-CM | POA: Diagnosis not present

## 2018-07-19 DIAGNOSIS — E669 Obesity, unspecified: Secondary | ICD-10-CM | POA: Insufficient documentation

## 2018-07-19 MED ORDER — FLUTICASONE PROPIONATE 50 MCG/ACT NA SUSP: 2.0000 | Freq: Every day | NASAL | 12 refills | Status: DC

## 2018-07-19 MED ORDER — MEDROXYPROGESTERONE ACETATE 150 MG/ML IM SUSP
150.0000 mg | INTRAMUSCULAR | Status: AC
  Administered 2018-07-19: 150 mg via INTRAMUSCULAR

## 2018-07-19 MED ORDER — ESTRADIOL 0.1 MG/GM VA CREA: 1.0000 | TOPICAL_CREAM | Freq: Every evening | VAGINAL | 3 refills | Status: DC | PRN

## 2018-07-19 NOTE — Progress Notes (Signed)
BP 132/84   Pulse 84   Temp 98.5 F (36.9 C) (Oral)   Ht 5' 1.2" (1.554 m)   Wt 222 lb 12.8 oz (101.1 kg)   SpO2 98%   BMI 41.82 kg/m    Subjective:    Patient ID: Stacey Marshall, female    DOB: May 09, 1960, 57 y.o.   MRN: [id number]  HPI: Stacey Marshall is a 58 y.o. female presenting on 07/19/2018 for comprehensive medical examination. Current medical complaints include:see below  Has tried to come off the depo provera injections several times but always ends up with a period once off of it. Wanting to stay on for now. Has not had hormone testing for several years now to see where she's at in peri-menopause.   Using estrace cream about once a week which works well for her vaginal dryness and irritation. No side effects.   Concerned about her weight. States she eats very well, mostly vegetables and boiled chicken but still slowly gains weight. Does not exercise.   Allergies - uses flonase daily which keeps things under control, uses allegra prn.   Followed by Rheumatology for lupus, stable on plaquenil.   She currently lives with: Menopausal Symptoms: no  Depression Screen done today and results listed below:  Depression screen [facility] 2/9 07/19/2018 06/11/2017 06/07/2016  Decreased Interest 0 0 0  Down, Depressed, Hopeless 0 0 0  PHQ - 2 Score 0 0 0  Altered sleeping 0 - -  Tired, decreased energy 0 - -  Change in appetite 0 - -  Feeling bad or failure about yourself  0 - -  Trouble concentrating 0 - -  Moving slowly or fidgety/restless 0 - -  Suicidal thoughts 0 - -  PHQ-9 Score 0 - -  Difficult doing work/chores Not difficult at all - -    The patient does not have a history of falls. I did not complete a risk assessment for falls. A plan of care for falls was not documented.   Past Medical History:  Past Medical History:  Diagnosis Date  . Discoid lupus     Surgical History:  Past Surgical History:  Procedure Laterality Date  . MOUTH SURGERY       Medications:  Current Outpatient Medications on File Prior to Visit  Medication Sig  . hydroxychloroquine (PLAQUENIL) 200 MG tablet Take 200 mg by mouth 2 (two) times daily.   . cetirizine (ZYRTEC) 10 MG tablet Take 10 mg by mouth daily.  . fexofenadine (ALLEGRA ALLERGY) 180 MG tablet Take 1 tablet (180 mg total) by mouth daily. (Patient not taking: Reported on 07/19/2018)   Current Facility-Administered Medications on File Prior to Visit  Medication  . medroxyPROGESTERone (DEPO-PROVERA) injection 150 mg    Allergies:  Allergies  Allergen Reactions  . Bactrim [Sulfamethoxazole-Trimethoprim] Hives    Social History:  Social History   Socioeconomic History  . Marital status: Single    Spouse name: Not on file  . Number of children: Not on file  . Years of education: Not on file  . Highest education level: Not on file  Occupational History  . Not on file  Social Needs  . Financial resource strain: Not on file  . Food insecurity    Worry: Not on file    Inability: Not on file  . Transportation needs    Medical: Not on file    Non-medical: Not on file  Tobacco Use  . Smoking status: Never Smoker  . Smokeless  tobacco: Never Used  Substance and Sexual Activity  . Alcohol use: Yes    Alcohol/week: 2.0 - 3.0 standard drinks    Types: 2 - 3 Glasses of wine per week  . Drug use: No  . Sexual activity: Never    Birth control/protection: None  Lifestyle  . Physical activity    Days per week: Not on file    Minutes per session: Not on file  . Stress: Not on file  Relationships  . Social Herbalist on phone: Not on file    Gets together: Not on file    Attends religious service: Not on file    Active member of club or organization: Not on file    Attends meetings of clubs or organizations: Not on file    Relationship status: Not on file  . Intimate partner violence    Fear of current or ex partner: Not on file    Emotionally abused: Not on file    Physically  abused: Not on file    Forced sexual activity: Not on file  Other Topics Concern  . Not on file  Social History Narrative  . Not on file   Social History   Tobacco Use  Smoking Status Never Smoker  Smokeless Tobacco Never Used   Social History   Substance and Sexual Activity  Alcohol Use Yes  . Alcohol/week: 2.0 - 3.0 standard drinks  . Types: 2 - 3 Glasses of wine per week    Family History:  Family History  Problem Relation Age of Onset  . Diabetes Mother   . Hypertension Mother   . Diabetes Father   . Hypertension Father     Past medical history, surgical history, medications, allergies, family history and social history reviewed with patient today and changes made to appropriate areas of the chart.   Review of Systems - General ROS: negative Psychological ROS: negative Ophthalmic ROS: negative ENT ROS: negative Allergy and Immunology ROS: negative Hematological and Lymphatic ROS: negative Endocrine ROS: negative Breast ROS: negative for breast lumps Respiratory ROS: no cough, shortness of breath, or wheezing Cardiovascular ROS: no chest pain or dyspnea on exertion Gastrointestinal ROS: no abdominal pain, change in bowel habits, or black or bloody stools Genito-Urinary ROS: positive for - change in menstrual cycle Musculoskeletal ROS: negative Neurological ROS: no TIA or stroke symptoms Dermatological ROS: negative All other ROS negative except what is listed above and in the HPI.      Objective:    BP 132/84   Pulse 84   Temp 98.5 F (36.9 C) (Oral)   Ht 5' 1.2" (1.554 m)   Wt 222 lb 12.8 oz (101.1 kg)   SpO2 98%   BMI 41.82 kg/m   Wt Readings from Last 3 Encounters:  07/19/18 222 lb 12.8 oz (101.1 kg)  06/11/17 203 lb (92.1 kg)  10/02/16 200 lb (90.7 kg)    Physical Exam Vitals signs and nursing note reviewed.  Constitutional:      General: She is not in acute distress.    Appearance: She is well-developed.  HENT:     Head: Atraumatic.      Right Ear: External ear normal.     Left Ear: External ear normal.     Nose: Nose normal.     Mouth/Throat:     Pharynx: No oropharyngeal exudate.  Eyes:     General: No scleral icterus.    Conjunctiva/sclera: Conjunctivae normal.     Pupils: Pupils are  equal, round, and reactive to light.  Neck:     Musculoskeletal: Normal range of motion and neck supple.     Thyroid: No thyromegaly.  Cardiovascular:     Rate and Rhythm: Normal rate and regular rhythm.     Heart sounds: Normal heart sounds.  Pulmonary:     Effort: Pulmonary effort is normal. No respiratory distress.     Breath sounds: Normal breath sounds.  Chest:     Breasts:        Right: No mass, skin change or tenderness.        Left: No mass, skin change or tenderness.  Abdominal:     General: Bowel sounds are normal.     Palpations: Abdomen is soft. There is no mass.     Tenderness: There is no abdominal tenderness.  Musculoskeletal: Normal range of motion.        General: No tenderness.  Lymphadenopathy:     Cervical: No cervical adenopathy.  Skin:    General: Skin is warm and dry.     Findings: No rash.  Neurological:     Mental Status: She is alert and oriented to person, place, and time.     Cranial Nerves: No cranial nerve deficit.  Psychiatric:        Mood and Affect: Mood normal.        Behavior: Behavior normal.        Thought Content: Thought content normal.        Judgment: Judgment normal.     Results for orders placed or performed in visit on 07/19/18  Microscopic Examination   URINE  Result Value Ref Range   WBC, UA 6-10 (A) 0 - 5 /hpf   RBC 0-2 0 - 2 /hpf   Epithelial Cells (non renal) 0-10 0 - 10 /hpf   Bacteria, UA Few (A) None seen/Few  Urine Culture, Reflex   URINE  Result Value Ref Range   Urine Culture, Routine Final report    Organism ID, Bacteria No growth   CBC with Differential/Platelet  Result Value Ref Range   WBC 8.1 3.4 - 10.8 x10E3/uL   RBC 4.28 3.77 - 5.28 x10E6/uL    Hemoglobin 13.5 11.1 - 15.9 g/dL   Hematocrit 41.7 40.8 - 46.6 %   MCV 94 79 - 97 fL   MCH 31.5 26.6 - 33.0 pg   MCHC 33.6 31.5 - 35.7 g/dL   RDW 14.4 81.8 - 56.3 %   Platelets 213 150 - 450 x10E3/uL   Neutrophils 44 Not Estab. %   Lymphs 44 Not Estab. %   Monocytes 7 Not Estab. %   Eos 4 Not Estab. %   Basos 1 Not Estab. %   Neutrophils Absolute 3.5 1.4 - 7.0 x10E3/uL   Lymphocytes Absolute 3.6 (H) 0.7 - 3.1 x10E3/uL   Monocytes Absolute 0.5 0.1 - 0.9 x10E3/uL   EOS (ABSOLUTE) 0.4 0.0 - 0.4 x10E3/uL   Basophils Absolute 0.0 0.0 - 0.2 x10E3/uL   Immature Granulocytes 0 Not Estab. %   Immature Grans (Abs) 0.0 0.0 - 0.1 x10E3/uL  Comprehensive metabolic panel  Result Value Ref Range   Glucose 89 65 - 99 mg/dL   BUN 14 6 - 24 mg/dL   Creatinine, Ser 1.49 (H) 0.57 - 1.00 mg/dL   GFR calc non Af Amer 61 >59 mL/min/1.73   GFR calc Af Amer 70 >59 mL/min/1.73   BUN/Creatinine Ratio 14 9 - 23   Sodium 141 134 - 144 mmol/L  Potassium 4.5 3.5 - 5.2 mmol/L   Chloride 104 96 - 106 mmol/L   CO2 23 20 - 29 mmol/L   Calcium 9.5 8.7 - 10.2 mg/dL   Total Protein 7.5 6.0 - 8.5 g/dL   Albumin 4.4 3.8 - 4.9 g/dL   Globulin, Total 3.1 1.5 - 4.5 g/dL   Albumin/Globulin Ratio 1.4 1.2 - 2.2   Bilirubin Total 0.3 0.0 - 1.2 mg/dL   Alkaline Phosphatase 83 39 - 117 IU/L   AST 30 0 - 40 IU/L   ALT 23 0 - 32 IU/L  Lipid Panel w/o Chol/HDL Ratio  Result Value Ref Range   Cholesterol, Total 188 100 - 199 mg/dL   Triglycerides 82 0 - 149 mg/dL   HDL 57 >16>39 mg/dL   VLDL Cholesterol Cal 16 5 - 40 mg/dL   LDL Calculated 109115 (H) 0 - 99 mg/dL  UA/M w/rflx Culture, Routine   Specimen: Urine   URINE  Result Value Ref Range   Specific Gravity, UA 1.025 1.005 - 1.030   pH, UA 7.0 5.0 - 7.5   Color, UA Yellow Yellow   Appearance Ur Clear Clear   Leukocytes,UA 3+ (A) Negative   Protein,UA Negative Negative/Trace   Glucose, UA Negative Negative   Ketones, UA Negative Negative   RBC, UA Trace (A)  Negative   Bilirubin, UA Negative Negative   Urobilinogen, Ur 1.0 0.2 - 1.0 mg/dL   Nitrite, UA Negative Negative   Microscopic Examination See below:    Urinalysis Reflex Comment       Assessment & Plan:   Problem List Items Addressed This Visit      Respiratory   Allergic rhinitis - Primary    Stable on flonase and prn allegra. Continue current regimen        Musculoskeletal and Integument   Discoid lupus    Stable on plaquenil, continue per Rheumatology recommendations        Other   Obesity    Discussed at length good diet and exercise strategies. She is agreeable to working on these       Other Visit Diagnoses    Annual physical exam       Relevant Orders   CBC with Differential/Platelet (Completed)   Comprehensive metabolic panel (Completed)   Lipid Panel w/o Chol/HDL Ratio (Completed)   UA/M w/rflx Culture, Routine (Completed)   Encounter for initial prescription of injectable contraceptive       Continue depo injections for now, will repeat hormone labs when patient is ready to do so   Relevant Medications   medroxyPROGESTERone (DEPO-PROVERA) injection 150 mg   Screening for colon cancer       Relevant Orders   Ambulatory referral to Gastroenterology   Screening for breast cancer       Relevant Orders   MM 3D SCREEN BREAST BILATERAL   Vaginal irritation       Resolved on prn estrace cream, continue current regimen       Follow up plan: Return in about 1 year (around 07/19/2019) for CPE.   LABORATORY TESTING:  - Pap smear: up to date  IMMUNIZATIONS:   - Tdap: Tetanus vaccination status reviewed: last tetanus booster within 10 years. - Influenza: Postponed to flu season  SCREENING: -Mammogram: Ordered today  - Colonoscopy: Ordered today   PATIENT COUNSELING:   Advised to take 1 mg of folate supplement per day if capable of pregnancy.   Sexuality: Discussed sexually transmitted diseases, partner selection, use of condoms,  avoidance of  unintended pregnancy  and contraceptive alternatives.   Advised to avoid cigarette smoking.  I discussed with the patient that most people either abstain from alcohol or drink within safe limits (<=14/week and <=4 drinks/occasion for males, <=7/weeks and <= 3 drinks/occasion for females) and that the risk for alcohol disorders and other health effects rises proportionally with the number of drinks per week and how often a drinker exceeds daily limits.  Discussed cessation/primary prevention of drug use and availability of treatment for abuse.   Diet: Encouraged to adjust caloric intake to maintain  or achieve ideal body weight, to reduce intake of dietary saturated fat and total fat, to limit sodium intake by avoiding high sodium foods and not adding table salt, and to maintain adequate dietary potassium and calcium preferably from fresh fruits, vegetables, and low-fat dairy products.    stressed the importance of regular exercise  Injury prevention: Discussed safety belts, safety helmets, smoke detector, smoking near bedding or upholstery.   Dental health: Discussed importance of regular tooth brushing, flossing, and dental visits.    NEXT PREVENTATIVE PHYSICAL DUE IN 1 YEAR. Return in about 1 year (around 07/19/2019) for CPE.

## 2018-07-20 LAB — COMPREHENSIVE METABOLIC PANEL
ALT: 23 IU/L (ref 0–32)
AST: 30 IU/L (ref 0–40)
Albumin/Globulin Ratio: 1.4 (ref 1.2–2.2)
Albumin: 4.4 g/dL (ref 3.8–4.9)
Alkaline Phosphatase: 83 IU/L (ref 39–117)
BUN/Creatinine Ratio: 14 (ref 9–23)
BUN: 14 mg/dL (ref 6–24)
Bilirubin Total: 0.3 mg/dL (ref 0.0–1.2)
CO2: 23 mmol/L (ref 20–29)
Calcium: 9.5 mg/dL (ref 8.7–10.2)
Chloride: 104 mmol/L (ref 96–106)
Creatinine, Ser: 1.03 mg/dL — ABNORMAL HIGH (ref 0.57–1.00)
GFR calc Af Amer: 70 mL/min/{1.73_m2} (ref >59)
GFR calc non Af Amer: 61 mL/min/{1.73_m2} (ref >59)
Globulin, Total: 3.1 g/dL (ref 1.5–4.5)
Glucose: 89 mg/dL (ref 65–99)
Potassium: 4.5 mmol/L (ref 3.5–5.2)
Sodium: 141 mmol/L (ref 134–144)
Total Protein: 7.5 g/dL (ref 6.0–8.5)

## 2018-07-20 LAB — CBC WITH DIFFERENTIAL/PLATELET
Basophils Absolute: 0 10*3/uL (ref 0.0–0.2)
Basos: 1 %
EOS (ABSOLUTE): 0.4 10*3/uL (ref 0.0–0.4)
Eos: 4 %
Hematocrit: 40.2 % (ref 34.0–46.6)
Hemoglobin: 13.5 g/dL (ref 11.1–15.9)
Immature Grans (Abs): 0 10*3/uL (ref 0.0–0.1)
Immature Granulocytes: 0 %
Lymphocytes Absolute: 3.6 10*3/uL — ABNORMAL HIGH (ref 0.7–3.1)
Lymphs: 44 %
MCH: 31.5 pg (ref 26.6–33.0)
MCHC: 33.6 g/dL (ref 31.5–35.7)
MCV: 94 fL (ref 79–97)
Monocytes Absolute: 0.5 10*3/uL (ref 0.1–0.9)
Monocytes: 7 %
Neutrophils Absolute: 3.5 10*3/uL (ref 1.4–7.0)
Neutrophils: 44 %
Platelets: 213 10*3/uL (ref 150–450)
RBC: 4.28 x10E6/uL (ref 3.77–5.28)
RDW: 13.4 % (ref 11.7–15.4)
WBC: 8.1 10*3/uL (ref 3.4–10.8)

## 2018-07-20 LAB — LIPID PANEL W/O CHOL/HDL RATIO
Cholesterol, Total: 188 mg/dL (ref 100–199)
HDL: 57 mg/dL (ref >39)
LDL Calculated: 115 mg/dL — ABNORMAL HIGH (ref 0–99)
Triglycerides: 82 mg/dL (ref 0–149)
VLDL Cholesterol Cal: 16 mg/dL (ref 5–40)

## 2018-07-21 LAB — UA/M W/RFLX CULTURE, ROUTINE
Bilirubin, UA: NEGATIVE
Glucose, UA: NEGATIVE
Ketones, UA: NEGATIVE
Nitrite, UA: NEGATIVE
Protein,UA: NEGATIVE
Specific Gravity, UA: 1.025 (ref 1.005–1.030)
Urobilinogen, Ur: 1 mg/dL (ref 0.2–1.0)
pH, UA: 7 (ref 5.0–7.5)

## 2018-07-21 LAB — URINE CULTURE, REFLEX: Organism ID, Bacteria: NO GROWTH

## 2018-07-21 LAB — MICROSCOPIC EXAMINATION

## 2018-07-23 DIAGNOSIS — J309 Allergic rhinitis, unspecified: Secondary | ICD-10-CM | POA: Insufficient documentation

## 2018-07-23 NOTE — Assessment & Plan Note (Signed)
Stable on plaquenil, continue per Rheumatology recommendations

## 2018-07-23 NOTE — Assessment & Plan Note (Signed)
Discussed at length good diet and exercise strategies. She is agreeable to working on these

## 2018-07-23 NOTE — Assessment & Plan Note (Signed)
Stable on flonase and prn allegra. Continue current regimen

## 2018-07-25 ENCOUNTER — Encounter: Payer: Self-pay | Admitting: Family Medicine

## 2018-07-29 ENCOUNTER — Encounter: Payer: Managed Care, Other (non HMO) | Admitting: Family Medicine

## 2018-07-31 ENCOUNTER — Encounter: Payer: Managed Care, Other (non HMO) | Admitting: Family Medicine

## 2018-08-01 ENCOUNTER — Other Ambulatory Visit: Payer: Self-pay | Admitting: Family Medicine

## 2018-08-01 MED ORDER — ESTRADIOL 0.1 MG/GM VA CREA: 1.0000 | TOPICAL_CREAM | VAGINAL | 3 refills | Status: DC

## 2018-08-14 ENCOUNTER — Telehealth: Payer: Self-pay | Admitting: Gastroenterology

## 2018-08-14 DIAGNOSIS — Z1211 Encounter for screening for malignant neoplasm of colon: Secondary | ICD-10-CM

## 2018-08-14 MED ORDER — NA SULFATE-K SULFATE-MG SULF 17.5-3.13-1.6 GM/177ML PO SOLN: 1.0000 | Freq: Once | ORAL | 0 refills | Status: AC

## 2018-08-14 NOTE — Telephone Encounter (Signed)
Gastroenterology Pre-Procedure Review  Request Date: 08/19/18 Requesting Physician: Dr. Marius Ditch  PATIENT REVIEW QUESTIONS: The patient responded to the following health history questions as indicated:    1. Are you having any GI issues? no 2. Do you have a personal history of Polyps? Maybe 1 polyp in the past (Unsure) 3. Do you have a family history of Colon Cancer or Polyps? no 4. Diabetes Mellitus? no 5. Joint replacements in the past 12 months?no 6. Major health problems in the past 3 months?no 7. Any artificial heart valves, MVP, or defibrillator?no    MEDICATIONS & ALLERGIES:    Patient reports the following regarding taking any anticoagulation/antiplatelet therapy:   Plavix, Coumadin, Eliquis, Xarelto, Lovenox, Pradaxa, Brilinta, or Effient? no Aspirin? no  Patient confirms/reports the following medications:  Current Outpatient Medications  Medication Sig Dispense Refill  . cetirizine (ZYRTEC) 10 MG tablet Take 10 mg by mouth daily.    [name] estradiol (ESTRACE) 0.1 MG/GM vaginal cream Place 1 Applicatorful vaginally 2 (two) times a week. 42.5 g 3  . fexofenadine (ALLEGRA ALLERGY) 180 MG tablet Take 1 tablet (180 mg total) by mouth daily. (Patient not taking: Reported on 07/19/2018) 30 tablet 12  . fluticasone (FLONASE) 50 MCG/ACT nasal spray Place 2 sprays into both nostrils daily. 16 g 12  . hydroxychloroquine (PLAQUENIL) 200 MG tablet Take 200 mg by mouth 2 (two) times daily.      Current Facility-Administered Medications  Medication Dose Route Frequency Provider Last Rate Last Dose  . medroxyPROGESTERone (DEPO-PROVERA) injection 150 mg  150 mg Intramuscular Q90 days Volney American, PA-C   150 mg at 05/01/18 1325  . medroxyPROGESTERone (DEPO-PROVERA) injection 150 mg  150 mg Intramuscular Q90 days Volney American, PA-C   150 mg at 07/19/18 1312    Patient confirms/reports the following allergies:  Allergies  Allergen Reactions  . Bactrim  [Sulfamethoxazole-Trimethoprim] Hives    No orders of the defined types were placed in this encounter.   AUTHORIZATION INFORMATION Primary Insurance: 1D#: Group #:  Secondary Insurance: 1D#: Group #:  SCHEDULE INFORMATION: Date: 08/19/18 Time: Location:[facility]

## 2018-08-14 NOTE — Telephone Encounter (Signed)
Patient called to schedule an appt for a colonoscopy

## 2018-08-15 ENCOUNTER — Other Ambulatory Visit: Payer: Self-pay

## 2018-08-15 ENCOUNTER — Other Ambulatory Visit
Admission: RE | Admit: 2018-08-15 | Discharge: 2018-08-15 | Disposition: A | Discharge status: Home / Self Care | Payer: Managed Care, Other (non HMO) | Source: Ambulatory Visit | Attending: Gastroenterology | Admitting: Gastroenterology

## 2018-08-15 DIAGNOSIS — Z1159 Encounter for screening for other viral diseases: Secondary | ICD-10-CM | POA: Diagnosis not present

## 2018-08-15 DIAGNOSIS — Z01812 Encounter for preprocedural laboratory examination: Secondary | ICD-10-CM | POA: Diagnosis not present

## 2018-08-16 LAB — SARS CORONAVIRUS 2 (TAT 6-24 HRS): SARS Coronavirus 2: NEGATIVE

## 2018-08-19 ENCOUNTER — Encounter: Payer: Self-pay | Admitting: Anesthesiology

## 2018-08-19 ENCOUNTER — Ambulatory Visit: Payer: Managed Care, Other (non HMO) | Admitting: Certified Registered"

## 2018-08-19 ENCOUNTER — Ambulatory Visit
Admission: RE | Admit: 2018-08-19 | Discharge: 2018-08-19 | Disposition: A | Discharge status: Home / Self Care | Payer: Managed Care, Other (non HMO) | Attending: Gastroenterology | Admitting: Gastroenterology

## 2018-08-19 ENCOUNTER — Encounter
Admission: RE | Disposition: A | Discharge status: Home / Self Care | Payer: Self-pay | Source: Home / Self Care | Attending: Gastroenterology

## 2018-08-19 DIAGNOSIS — Z79899 Other long term (current) drug therapy: Secondary | ICD-10-CM | POA: Diagnosis not present

## 2018-08-19 DIAGNOSIS — L930 Discoid lupus erythematosus: Secondary | ICD-10-CM | POA: Diagnosis not present

## 2018-08-19 DIAGNOSIS — Z79890 Hormone replacement therapy (postmenopausal): Secondary | ICD-10-CM | POA: Insufficient documentation

## 2018-08-19 DIAGNOSIS — Z1211 Encounter for screening for malignant neoplasm of colon: Secondary | ICD-10-CM

## 2018-08-19 DIAGNOSIS — K621 Rectal polyp: Secondary | ICD-10-CM | POA: Diagnosis not present

## 2018-08-19 HISTORY — PX: COLONOSCOPY WITH PROPOFOL: SHX5780

## 2018-08-19 SURGERY — COLONOSCOPY WITH PROPOFOL
Anesthesia: General

## 2018-08-19 MED ORDER — PROPOFOL 10 MG/ML IV BOLUS
INTRAVENOUS | Status: DC | PRN
  Administered 2018-08-19 (×6): 50 mg via INTRAVENOUS

## 2018-08-19 MED ORDER — SODIUM CHLORIDE 0.9 % IV SOLN
INTRAVENOUS | Status: DC
  Administered 2018-08-19: 10:00:00 1000 mL via INTRAVENOUS

## 2018-08-19 NOTE — Op Note (Signed)
[facility] Gastroenterology Patient Name: Stacey Marshall Procedure Date: 08/19/2018 10:26 AM MRN: [id number] Account #: [id number] Date of Birth: 31-May-1960 Admit Type: Outpatient Age: 58 Room: [facility] ENDO ROOM 4 Gender: Female Note Status: Finalized Procedure:            Colonoscopy Indications:          Screening for colorectal malignant neoplasm, Last                        colonoscopy: November 2014 Providers:            Lin Landsman MD, MD Medicines:            Monitored Anesthesia Care Complications:        No immediate complications. Estimated blood loss: None. Procedure:            Pre-Anesthesia Assessment:                       - Prior to the procedure, a History and Physical was                        performed, and patient medications and allergies were                        reviewed. The patient is competent. The risks and                        benefits of the procedure and the sedation options and                        risks were discussed with the patient. All questions                        were answered and informed consent was obtained.                        Patient identification and proposed procedure were                        verified by the physician, the nurse, the                        anesthesiologist, the anesthetist and the technician in                        the pre-procedure area in the procedure room in the                        endoscopy suite. Mental Status Examination: alert and                        oriented. Airway Examination: normal oropharyngeal                        airway and neck mobility. Respiratory Examination:                        clear to auscultation. CV Examination: normal.  Prophylactic Antibiotics: The patient does not require                        prophylactic antibiotics. Prior Anticoagulants: The                        patient has taken no previous anticoagulant or                 antiplatelet agents. ASA Grade Assessment: II - A                        patient with mild systemic disease. After reviewing the                        risks and benefits, the patient was deemed in                        satisfactory condition to undergo the procedure. The                        anesthesia plan was to use monitored anesthesia care                        (MAC). Immediately prior to administration of                        medications, the patient was re-assessed for adequacy                        to receive sedatives. The heart rate, respiratory rate,                        oxygen saturations, blood pressure, adequacy of                        pulmonary ventilation, and response to care were                        monitored throughout the procedure. The physical status                        of the patient was re-assessed after the procedure.                       After obtaining informed consent, the colonoscope was                        passed under direct vision. Throughout the procedure,                        the patient's blood pressure, pulse, and oxygen                        saturations were monitored continuously. The                        Colonoscope was introduced through the anus and                        advanced to the the cecum, identified by appendiceal  orifice and ileocecal valve. The colonoscopy was                        performed without difficulty. The patient tolerated the                        procedure well. The quality of the bowel preparation                        was good. Findings:      The perianal and digital rectal examinations were normal. Pertinent       negatives include normal sphincter tone and no palpable rectal lesions.      A 5 mm polyp was found in the rectum. The polyp was sessile. The polyp       was removed with a cold snare. Resection and retrieval were complete.      The retroflexed  view of the distal rectum and anal verge was normal and       showed no anal or rectal abnormalities. Impression:           - One 5 mm polyp in the rectum, removed with a cold                        snare. Resected and retrieved.                       - The distal rectum and anal verge are normal on                        retroflexion view. Recommendation:       - Discharge patient to home (with escort).                       - Resume previous diet today.                       - Continue present medications.                       - Await pathology results.                       - Repeat colonoscopy in 7 years for surveillance based                        on pathology results. Procedure Code(s):    --- Professional ---                       [phone], Colonoscopy, flexible; with removal of tumor(s),                        polyp(s), or other lesion(s) by snare technique Diagnosis Code(s):    --- Professional ---                       Z12.11, Encounter for screening for malignant neoplasm                        of colon  K62.1, Rectal polyp CPT copyright 2019 American Medical Association. All rights reserved. The codes documented in this report are preliminary and upon coder review may  be revised to meet current compliance requirements. Dr. Ulyess Mort Lin Landsman MD, MD 08/19/2018 10:53:40 AM This report has been signed electronically. Number of Addenda: 0 Note Initiated On: 08/19/2018 10:26 AM Scope Withdrawal Time: 0 hours 8 minutes 23 seconds  Total Procedure Duration: 0 hours 10 minutes 45 seconds  Estimated Blood Loss: Estimated blood loss: none.      [facility]

## 2018-08-19 NOTE — Anesthesia Preprocedure Evaluation (Signed)
Anesthesia Evaluation  Patient identified by MRN, date of birth, ID band Patient awake    Reviewed: Allergy & Precautions, NPO status , Patient's Chart, lab work & pertinent test results  Airway Mallampati: III       Dental   Pulmonary neg pulmonary ROS,  Sinus congestion          Cardiovascular negative cardio ROS       Neuro/Psych negative neurological ROS  negative psych ROS   GI/Hepatic Neg liver ROS,   Endo/Other  negative endocrine ROS  Renal/GU negative Renal ROS  negative genitourinary   Musculoskeletal   Abdominal   Peds negative pediatric ROS (+)  Hematology   Anesthesia Other Findings Past Medical History: 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     Dental advisory given  Plan Discussed with: CRNA and Surgeon  Anesthesia Plan Comments:         Anesthesia Quick Evaluation

## 2018-08-19 NOTE — H&P (Signed)
Cephas Darby, MD [address]  [place]  [place], [place] [address]  Main: [phone]  Fax: [phone] Pager: [phone]  Primary Care Physician:  Guadalupe Maple, MD Primary Gastroenterologist:  Dr. Cephas Darby  Pre-Procedure History & Physical: HPI:  Stacey Marshall is a 58 y.o. female is here for an colonoscopy.   Past Medical History:  Diagnosis Date  . Discoid lupus     Past Surgical History:  Procedure Laterality Date  . MOUTH SURGERY      Prior to Admission medications   Medication Sig Start Date End Date Taking? Authorizing Provider  cetirizine (ZYRTEC) 10 MG tablet Take 10 mg by mouth daily.    [provider]  estradiol (ESTRACE) 0.1 MG/GM vaginal cream Place 1 Applicatorful vaginally 2 (two) times a week. 08/01/18   Volney American, PA-C  fluticasone [facility]) 50 MCG/ACT nasal spray Place 2 sprays into both nostrils daily. 07/19/18   Volney American, PA-C  hydroxychloroquine (PLAQUENIL) 200 MG tablet Take 200 mg by mouth 2 (two) times daily.  05/17/16   [provider]    Allergies as of 08/14/2018 - Review Complete 07/19/2018  Allergen Reaction Noted  . Bactrim [sulfamethoxazole-trimethoprim] Hives 06/10/2014    Family History  Problem Relation Age of Onset  . Diabetes Mother   . Hypertension Mother   . Diabetes Father   . Hypertension Father     Social History   Socioeconomic History  . Marital status: Single    Spouse name: Not on file  . Number of children: Not on file  . Years of education: Not on file  . Highest education level: Not on file  Occupational History  . Not on file  Social Needs  . Financial resource strain: Not on file  . Food insecurity    Worry: Not on file    Inability: Not on file  . Transportation needs    Medical: Not on file    Non-medical: Not on file  Tobacco Use  . Smoking status: Never Smoker  . Smokeless tobacco: Never Used  Substance and Sexual Activity  .  Alcohol use: Yes    Alcohol/week: 2.0 - 3.0 standard drinks    Types: 2 - 3 Glasses of wine per week  . Drug use: No  . Sexual activity: Never    Birth control/protection: None  Lifestyle  . Physical activity    Days per week: Not on file    Minutes per session: Not on file  . Stress: Not on file  Relationships  . Social Herbalist on phone: Not on file    Gets together: Not on file    Attends religious service: Not on file    Active member of club or organization: Not on file    Attends meetings of clubs or organizations: Not on file    Relationship status: Not on file  . Intimate partner violence    Fear of current or ex partner: Not on file    Emotionally abused: Not on file    Physically abused: Not on file    Forced sexual activity: Not on file  Other Topics Concern  . Not on file  Social History Narrative  . Not on file    Review of Systems: See HPI, otherwise negative ROS  Physical Exam: BP 135/85   Pulse 79   Temp 97.6 F (36.4 C) (Tympanic)   Resp 16   Ht 5\' 1"  (1.549 m)  Wt 99.8 kg   SpO2 100%   BMI 41.57 kg/m  General:   Alert,  pleasant and cooperative in NAD Head:  Normocephalic and atraumatic. Neck:  Supple; no masses or thyromegaly. Lungs:  Clear throughout to auscultation.    Heart:  Regular rate and rhythm. Abdomen:  Soft, nontender and nondistended. Normal bowel sounds, without guarding, and without rebound.   Neurologic:  Alert and  oriented x4;  grossly normal neurologically.  Impression/Plan: Stacey Marshall is here for an colonoscopy to be performed for colon cancer screening  Risks, benefits, limitations, and alternatives regarding  colonoscopy have been reviewed with the patient.  Questions have been answered.  All parties agreeable.   Lannette Donath, MD  08/19/2018, 9:51 AM

## 2018-08-19 NOTE — Anesthesia Postprocedure Evaluation (Signed)
Anesthesia Post Note  Patient: Stacey Marshall  Procedure(s) Performed: COLONOSCOPY WITH PROPOFOL (N/A )  Patient location during evaluation: Endoscopy Anesthesia Type: General Level of consciousness: awake and alert and oriented Pain management: pain level controlled Vital Signs Assessment: post-procedure vital signs reviewed and stable Respiratory status: spontaneous breathing Cardiovascular status: blood pressure returned to baseline Anesthetic complications: no     Last Vitals:  Vitals:   08/19/18 0941 08/19/18 1055  BP: 135/85 (!) 102/54  Pulse: 79   Resp: 16 (!) 21  Temp: 36.4 C   SpO2: 100%     Last Pain:  Vitals:   08/19/18 1125  TempSrc:   PainSc: 0-No pain                 Mossie Gilder

## 2018-08-19 NOTE — Transfer of Care (Signed)
Immediate Anesthesia Transfer of Care Note  Patient: Stacey Marshall  Procedure(s) Performed: COLONOSCOPY WITH PROPOFOL (N/A )  Patient Location: Endoscopy Unit  Anesthesia Type:General  Level of Consciousness: drowsy, patient cooperative and lethargic  Airway & Oxygen Therapy: Patient Spontanous Breathing and Patient connected to face mask oxygen  Post-op Assessment: Report given to RN and Post -op Vital signs reviewed and stable  Post vital signs: Reviewed and stable  Last Vitals:  Vitals Value Taken Time  BP    Temp    Pulse 92 08/19/18 1055  Resp 29 08/19/18 1055  SpO2 99 % 08/19/18 1055  Vitals shown include unvalidated device data.  Last Pain:  Vitals:   08/19/18 0941  TempSrc: Tympanic  PainSc: 0-No pain         Complications: No apparent anesthesia complications

## 2018-08-19 NOTE — Anesthesia Post-op Follow-up Note (Signed)
Anesthesia QCDR form completed.        

## 2018-08-20 ENCOUNTER — Encounter: Payer: Self-pay | Admitting: Gastroenterology

## 2018-08-21 LAB — SURGICAL PATHOLOGY

## 2018-08-26 ENCOUNTER — Encounter: Payer: Self-pay | Admitting: Gastroenterology

## 2018-09-19 ENCOUNTER — Other Ambulatory Visit: Payer: Self-pay | Admitting: Family Medicine

## 2018-10-10 ENCOUNTER — Other Ambulatory Visit: Payer: Self-pay

## 2018-10-10 ENCOUNTER — Ambulatory Visit (INDEPENDENT_AMBULATORY_CARE_PROVIDER_SITE_OTHER): Payer: Managed Care, Other (non HMO)

## 2018-10-10 DIAGNOSIS — Z3042 Encounter for surveillance of injectable contraceptive: Secondary | ICD-10-CM

## 2018-12-23 ENCOUNTER — Ambulatory Visit (INDEPENDENT_AMBULATORY_CARE_PROVIDER_SITE_OTHER): Payer: Managed Care, Other (non HMO)

## 2018-12-23 ENCOUNTER — Other Ambulatory Visit: Payer: Self-pay

## 2018-12-23 DIAGNOSIS — Z23 Encounter for immunization: Secondary | ICD-10-CM | POA: Diagnosis not present

## 2019-04-11 ENCOUNTER — Ambulatory Visit: Payer: Managed Care, Other (non HMO) | Attending: Internal Medicine

## 2019-04-11 DIAGNOSIS — Z23 Encounter for immunization: Secondary | ICD-10-CM | POA: Insufficient documentation

## 2019-04-11 NOTE — Progress Notes (Signed)
   Covid-19 Vaccination Clinic  Name:  Stacey Marshall    MRN: [id number] DOB: [birth year]  04/11/2019  Stacey Marshall was observed post Covid-19 immunization for 15 minutes without incident. She was provided with Vaccine Information Sheet and instruction to access the V-Safe system.   Stacey Marshall was instructed to call 911 with any severe reactions post vaccine: [name] Difficulty breathing  . Swelling of face and throat  . A fast heartbeat  . A bad rash all over body  . Dizziness and weakness   Immunizations Administered    Name Date Dose VIS Date Route   Pfizer COVID-19 Vaccine 04/11/2019 10:16 AM 0.3 mL 01/17/2019 Intramuscular   Manufacturer: [employer], [employer]   Lot: [id number]   NDC: [address]-2

## 2019-05-02 ENCOUNTER — Other Ambulatory Visit: Payer: Self-pay

## 2019-05-02 ENCOUNTER — Ambulatory Visit: Payer: Managed Care, Other (non HMO) | Attending: Internal Medicine

## 2019-05-02 DIAGNOSIS — Z23 Encounter for immunization: Secondary | ICD-10-CM

## 2019-05-02 NOTE — Progress Notes (Signed)
   Covid-19 Vaccination Clinic  Name:  Stacey Marshall    MRN: [id number] DOB: December 16, 1960  05/02/2019  [name] was observed post Covid-19 immunization for 15 minutes without incident. She was provided with Vaccine Information Sheet and instruction to access the V-Safe system.   [name] was instructed to call 911 with any severe reactions post vaccine: [name] Difficulty breathing  . Swelling of face and throat  . A fast heartbeat  . A bad rash all over body  . Dizziness and weakness   Immunizations Administered    Name Date Dose VIS Date Route   Pfizer COVID-19 Vaccine 05/02/2019  9:21 AM 0.3 mL 01/17/2019 Intramuscular   Manufacturer: [employer], [employer]   Lot: [id number]   NDC: [address]-8

## 2019-06-23 ENCOUNTER — Other Ambulatory Visit: Payer: Self-pay

## 2019-06-23 ENCOUNTER — Encounter: Payer: Self-pay | Admitting: Family Medicine

## 2019-06-23 ENCOUNTER — Ambulatory Visit (INDEPENDENT_AMBULATORY_CARE_PROVIDER_SITE_OTHER): Payer: Managed Care, Other (non HMO) | Admitting: Family Medicine

## 2019-06-23 VITALS — BP 120/86 | HR 77 | Temp 98.6°F | Wt 221.0 lb

## 2019-06-23 DIAGNOSIS — Z780 Asymptomatic menopausal state: Secondary | ICD-10-CM

## 2019-06-23 DIAGNOSIS — R42 Dizziness and giddiness: Secondary | ICD-10-CM | POA: Diagnosis not present

## 2019-06-23 DIAGNOSIS — E785 Hyperlipidemia, unspecified: Secondary | ICD-10-CM | POA: Insufficient documentation

## 2019-06-23 MED ORDER — MECLIZINE HCL 25 MG PO TABS: 25.0000 mg | ORAL_TABLET | Freq: Three times a day (TID) | ORAL | 0 refills | Status: DC | PRN

## 2019-06-23 NOTE — Patient Instructions (Signed)

## 2019-06-23 NOTE — Progress Notes (Signed)
BP 120/86   Pulse 77   Temp 98.6 F (37 C) (Oral)   Wt 221 lb (100.2 kg)   SpO2 97%   BMI 41.76 kg/m    Subjective:    Patient ID: Stacey Marshall, female    DOB: [birth year], 59 y.o.   MRN: [id number]  HPI: Stacey Marshall is a 59 y.o. female  Chief Complaint  Patient presents with  . Dizziness    x about 2 weeks  . Nausea    hot flash   Dizzy spells that typically happen when bending over or getting up too fast out of the bed. Gets these warm sensations over her body with them that last about 5 minutes. Room spinning dizziness any time moving head during spells. Has had two major spells so far, one where she missed work because it lasted nearly 10 hours. Some nausea but no vomiting during episodes. Denies syncope, speech or thought issues during episodes, weakness, numbness, CP, SOB. Not trying anything OTC for sxs.   Taking the estrogen cream once a week. Stopped the depo injections about a year ago to see what happens. So far has done well, no new issues. Feeling well overall.   Relevant past medical, surgical, family and social history reviewed and updated as indicated. Interim medical history since our last visit reviewed. Allergies and medications reviewed and updated.  Review of Systems  Per HPI unless specifically indicated above     Objective:    BP 120/86   Pulse 77   Temp 98.6 F (37 C) (Oral)   Wt 221 lb (100.2 kg)   SpO2 97%   BMI 41.76 kg/m   Wt Readings from Last 3 Encounters:  06/23/19 221 lb (100.2 kg)  08/19/18 220 lb (99.8 kg)  07/19/18 222 lb 12.8 oz (101.1 kg)    Physical Exam Vitals and nursing note reviewed.  Constitutional:      Appearance: Normal appearance. She is not ill-appearing.  HENT:     Head: Atraumatic.  Eyes:     Extraocular Movements: Extraocular movements intact.     Conjunctiva/sclera: Conjunctivae normal.  Cardiovascular:     Rate and Rhythm: Normal rate and regular rhythm.     Heart sounds: Normal heart  sounds.  Pulmonary:     Effort: Pulmonary effort is normal.     Breath sounds: Normal breath sounds.  Musculoskeletal:        General: Normal range of motion.     Cervical back: Normal range of motion and neck supple.  Skin:    General: Skin is warm and dry.  Neurological:     General: No focal deficit present.     Mental Status: She is alert and oriented to person, place, and time.  Psychiatric:        Mood and Affect: Mood normal.        Thought Content: Thought content normal.        Judgment: Judgment normal.     Results for orders placed or performed during the hospital encounter of 08/19/18  Surgical pathology  Result Value Ref Range   SURGICAL PATHOLOGY      Surgical Pathology CASE: [id number] PATIENT: Novella Rob Surgical Pathology Report     SPECIMEN SUBMITTED: A. Rectum polyp; cold snare  CLINICAL HISTORY: None provided  PRE-OPERATIVE DIAGNOSIS: Screening colonoscopy  POST-OPERATIVE DIAGNOSIS: Colon polyp    DIAGNOSIS: A.  RECTUM POLYP; COLD SNARE: - HYPERPLASTIC POLYP. - NEGATIVE FOR DYSPLASIA AND MALIGNANCY.  GROSS DESCRIPTION: A.  Labeled: Cold snared rectal colon polyp Received: In formalin Tissue fragment(s): 1 Size: 0.8 x 0.2 x 0.2 cm Description: Tan tissue fragment Entirely submitted in 1 cassette.   Final Diagnosis performed by Ronald Lobo, MD.   Electronically signed 08/21/2018 11:54:16AM The electronic signature indicates that the named Attending Pathologist has evaluated the specimen  Technical component performed at [facility], [address], [place], [place] [address] Lab: [phone] Dir: Jolene Schimke, MD, MMM  Professional component performed at [facility], [address] [place], [place], [place] [address] Lab: [phone] Dir: Georgiann Cocker. Oneita Kras, MD       Assessment & Plan:   Problem List Items Addressed This Visit    None    Visit Diagnoses    Dizziness    -  Primary   Suspect vertigo, tx  with prn meclizine, epley maneuvers. F/u if not resolving   Post-menopausal       Doing well off depot and just taking estrogen cream weekly. Continue current regimen      25 minutes today spent in direct care and counseling with patient  Follow up plan: Return in about 4 weeks (around 07/21/2019) for CPE.

## 2019-07-21 ENCOUNTER — Encounter: Payer: Managed Care, Other (non HMO) | Admitting: Family Medicine

## 2019-07-28 ENCOUNTER — Encounter: Payer: Self-pay | Admitting: Family Medicine

## 2019-07-28 ENCOUNTER — Other Ambulatory Visit: Payer: Self-pay

## 2019-07-28 ENCOUNTER — Ambulatory Visit (INDEPENDENT_AMBULATORY_CARE_PROVIDER_SITE_OTHER): Payer: Managed Care, Other (non HMO) | Admitting: Family Medicine

## 2019-07-28 ENCOUNTER — Other Ambulatory Visit (HOSPITAL_COMMUNITY)
Admission: RE | Admit: 2019-07-28 | Discharge: 2019-07-28 | Disposition: A | Discharge status: Home / Self Care | Payer: Managed Care, Other (non HMO) | Source: Ambulatory Visit | Attending: Family Medicine | Admitting: Family Medicine

## 2019-07-28 VITALS — BP 134/87 | HR 86 | Ht 61.18 in | Wt 222.2 lb

## 2019-07-28 DIAGNOSIS — E7800 Pure hypercholesterolemia, unspecified: Secondary | ICD-10-CM

## 2019-07-28 DIAGNOSIS — Z6841 Body Mass Index (BMI) 40.0 and over, adult: Secondary | ICD-10-CM | POA: Diagnosis not present

## 2019-07-28 DIAGNOSIS — Z1231 Encounter for screening mammogram for malignant neoplasm of breast: Secondary | ICD-10-CM | POA: Diagnosis not present

## 2019-07-28 DIAGNOSIS — Z0000 Encounter for general adult medical examination without abnormal findings: Secondary | ICD-10-CM | POA: Diagnosis not present

## 2019-07-28 DIAGNOSIS — L930 Discoid lupus erythematosus: Secondary | ICD-10-CM

## 2019-07-28 DIAGNOSIS — Z8619 Personal history of other infectious and parasitic diseases: Secondary | ICD-10-CM | POA: Diagnosis not present

## 2019-07-28 LAB — UA/M W/RFLX CULTURE, ROUTINE
Bilirubin, UA: NEGATIVE
Glucose, UA: NEGATIVE
Ketones, UA: NEGATIVE
Leukocytes,UA: NEGATIVE
Nitrite, UA: NEGATIVE
Protein,UA: NEGATIVE
Specific Gravity, UA: <1.005 — ABNORMAL LOW (ref 1.005–1.030)
Urobilinogen, Ur: 0.2 mg/dL (ref 0.2–1.0)
pH, UA: 5.5 (ref 5.0–7.5)

## 2019-07-28 LAB — MICROSCOPIC EXAMINATION
Bacteria, UA: NONE SEEN
WBC, UA: NONE SEEN /hpf (ref 0–5)

## 2019-07-28 MED ORDER — BUPROPION HCL ER (XL) 150 MG PO TB24: 150.0000 mg | ORAL_TABLET | Freq: Every day | ORAL | 0 refills | Status: DC

## 2019-07-28 NOTE — Progress Notes (Signed)
BP 134/87   Pulse 86   Ht 5' 1.18" (1.554 m)   Wt 222 lb 4 oz (100.8 kg)   SpO2 98%   BMI 41.75 kg/m    Subjective:    Patient ID: Stacey Marshall, female    DOB: Jan 14, 1961, 59 y.o.   MRN: [id number]  HPI: Stacey Marshall is a 59 y.o. female presenting on 07/28/2019 for comprehensive medical examination. Current medical complaints include:see below  Very minimal dizzy spells since last month. Has not had to take the meclizine often. Denies CP, SOB, diaphoresis, syncope associated.   Concerned about her weight - has tried saxenda, phentermine, and has been seen at bariatric clinic but nothing has worked out. Rashes with saxenda. Was told by bariatric clinic she was not a candidate due to lack of co-morbidities associated with her obesity. Tries to eat small portions, does not exercise.   Still using estrogen cream about once a week which works very well for her vaginal sxs post-menopause.   Followed by Rheumatology for her lupus, stable.   She currently lives with: Menopausal Symptoms: controlled with estrace cream  Depression Screen done today and results listed below:  Depression screen [facility] 2/9 07/28/2019 07/19/2018 06/11/2017 06/07/2016  Decreased Interest 0 0 0 0  Down, Depressed, Hopeless 0 0 0 0  PHQ - 2 Score 0 0 0 0  Altered sleeping - 0 - -  Tired, decreased energy - 0 - -  Change in appetite - 0 - -  Feeling bad or failure about yourself  - 0 - -  Trouble concentrating - 0 - -  Moving slowly or fidgety/restless - 0 - -  Suicidal thoughts - 0 - -  PHQ-9 Score - 0 - -  Difficult doing work/chores - Not difficult at all - -    The patient does not have a history of falls. I did complete a risk assessment for falls. A plan of care for falls was documented.   Past Medical History:  Past Medical History:  Diagnosis Date  . Discoid lupus     Surgical History:  Past Surgical History:  Procedure Laterality Date  . COLONOSCOPY WITH PROPOFOL N/A 08/19/2018    Procedure: COLONOSCOPY WITH PROPOFOL;  Surgeon: Toney Reil, MD;  Location: [facility] ENDOSCOPY;  Service: Gastroenterology;  Laterality: N/A;  . MOUTH SURGERY      Medications:  Current Outpatient Medications on File Prior to Visit  Medication Sig  . estradiol (ESTRACE) 0.1 MG/GM vaginal cream INSERT 1 APPLICATORFUL (4  GRAMS) VAGINALLY TWICE  WEEKLY  . fluticasone (FLONASE) 50 MCG/ACT nasal spray Place 2 sprays into both nostrils daily.  . hydroxychloroquine (PLAQUENIL) 200 MG tablet Take 200 mg by mouth 2 (two) times daily.   . meclizine (ANTIVERT) 25 MG tablet Take 1 tablet (25 mg total) by mouth 3 (three) times daily as needed for dizziness.   No current facility-administered medications on file prior to visit.    Allergies:  Allergies  Allergen Reactions  . Bactrim [Sulfamethoxazole-Trimethoprim] Hives  . Saxenda [Liraglutide -Weight Management] Rash    Social History:  Social History   Socioeconomic History  . Marital status: Single    Spouse name: Not on file  . Number of children: Not on file  . Years of education: Not on file  . Highest education level: Not on file  Occupational History  . Not on file  Tobacco Use  . Smoking status: Never Smoker  . Smokeless tobacco: Never Used  Vaping  Use  . Vaping Use: Never used  Substance and Sexual Activity  . Alcohol use: Yes    Alcohol/week: 2.0 - 3.0 standard drinks    Types: 2 - 3 Glasses of wine per week  . Drug use: No  . Sexual activity: Never    Birth control/protection: None  Other Topics Concern  . Not on file  Social History Narrative  . Not on file   Social Determinants of Health   Financial Resource Strain:   . Difficulty of Paying Living Expenses:   Food Insecurity:   . Worried About Programme researcher, broadcasting/film/video in the Last Year:   . Barista in the Last Year:   Transportation Needs:   . Freight forwarder (Medical):   [name] Lack of Transportation (Non-Medical):   Physical Activity:   . Days  of Exercise per Week:   . Minutes of Exercise per Session:   Stress:   . Feeling of Stress :   Social Connections:   . Frequency of Communication with Friends and Family:   . Frequency of Social Gatherings with Friends and Family:   . Attends Religious Services:   . Active Member of Clubs or Organizations:   . Attends Banker Meetings:   [name] Marital Status:   Intimate Partner Violence:   . Fear of Current or Ex-Partner:   . Emotionally Abused:   [name] Physically Abused:   . Sexually Abused:    Social History   Tobacco Use  Smoking Status Never Smoker  Smokeless Tobacco Never Used   Social History   Substance and Sexual Activity  Alcohol Use Yes  . Alcohol/week: 2.0 - 3.0 standard drinks  . Types: 2 - 3 Glasses of wine per week    Family History:  Family History  Problem Relation Age of Onset  . Diabetes Mother   . Hypertension Mother   . Diabetes Father   . Hypertension Father     Past medical history, surgical history, medications, allergies, family history and social history reviewed with patient today and changes made to appropriate areas of the chart.   Review of Systems - General ROS: negative Psychological ROS: negative Ophthalmic ROS: negative ENT ROS: negative Allergy and Immunology ROS: negative Hematological and Lymphatic ROS: negative Endocrine ROS: negative Breast ROS: negative for breast lumps Respiratory ROS: no cough, shortness of breath, or wheezing Cardiovascular ROS: no chest pain or dyspnea on exertion Gastrointestinal ROS: no abdominal pain, change in bowel habits, or black or bloody stools Genito-Urinary ROS: no dysuria, trouble voiding, or hematuria Musculoskeletal ROS: negative Neurological ROS: no TIA or stroke symptoms Dermatological ROS: negative All other ROS negative except what is listed above and in the HPI.      Objective:    BP 134/87   Pulse 86   Ht 5' 1.18" (1.554 m)   Wt 222 lb 4 oz (100.8 kg)   SpO2 98%    BMI 41.75 kg/m   Wt Readings from Last 3 Encounters:  07/28/19 222 lb 4 oz (100.8 kg)  06/23/19 221 lb (100.2 kg)  08/19/18 220 lb (99.8 kg)    Physical Exam Vitals and nursing note reviewed. Exam conducted with a chaperone present.  Constitutional:      General: She is not in acute distress.    Appearance: She is well-developed.  HENT:     Head: Atraumatic.     Right Ear: External ear normal.     Left Ear: External ear normal.  Nose: Nose normal.     Mouth/Throat:     Pharynx: No oropharyngeal exudate.  Eyes:     General: No scleral icterus.    Conjunctiva/sclera: Conjunctivae normal.     Pupils: Pupils are equal, round, and reactive to light.  Neck:     Thyroid: No thyromegaly.  Cardiovascular:     Rate and Rhythm: Normal rate and regular rhythm.     Heart sounds: Normal heart sounds.  Pulmonary:     Effort: Pulmonary effort is normal. No respiratory distress.     Breath sounds: Normal breath sounds.  Chest:     Breasts:        Right: No mass, skin change or tenderness.        Left: No mass, skin change or tenderness.  Abdominal:     General: Bowel sounds are normal.     Palpations: Abdomen is soft. There is no mass.     Tenderness: There is no abdominal tenderness.  Genitourinary:    General: Normal vulva.     Vagina: No vaginal discharge.  Musculoskeletal:        General: No tenderness. Normal range of motion.     Cervical back: Normal range of motion and neck supple.  Lymphadenopathy:     Cervical: No cervical adenopathy.     Upper Body:     Right upper body: No axillary adenopathy.     Left upper body: No axillary adenopathy.  Skin:    General: Skin is warm and dry.     Findings: No rash.  Neurological:     Mental Status: She is alert and oriented to person, place, and time.     Cranial Nerves: No cranial nerve deficit.  Psychiatric:        Behavior: Behavior normal.     Results for orders placed or performed in visit on 07/28/19  Microscopic  Examination   Urine  Result Value Ref Range   WBC, UA None seen 0 - 5 /hpf   RBC 0-2 0 - 2 /hpf   Epithelial Cells (non renal) 0-10 0 - 10 /hpf   Bacteria, UA None seen None seen/Few  CBC with Differential/Platelet  Result Value Ref Range   WBC 8.3 3.4 - 10.8 x10E3/uL   RBC 4.41 3.77 - 5.28 x10E6/uL   Hemoglobin 13.7 11.1 - 15.9 g/dL   Hematocrit 40.5 34.0 - 46.6 %   MCV 92 79 - 97 fL   MCH 31.1 26.6 - 33.0 pg   MCHC 33.8 31 - 35 g/dL   RDW 13.3 11.7 - 15.4 %   Platelets 183 150 - 450 x10E3/uL   Neutrophils 43 Not Estab. %   Lymphs 46 Not Estab. %   Monocytes 8 Not Estab. %   Eos 3 Not Estab. %   Basos 0 Not Estab. %   Neutrophils Absolute 3.6 1 - 7 x10E3/uL   Lymphocytes Absolute 3.8 (H) 0 - 3 x10E3/uL   Monocytes Absolute 0.6 0 - 0 x10E3/uL   EOS (ABSOLUTE) 0.2 0.0 - 0.4 x10E3/uL   Basophils Absolute 0.0 0 - 0 x10E3/uL   Immature Granulocytes 0 Not Estab. %   Immature Grans (Abs) 0.0 0.0 - 0.1 x10E3/uL  Comprehensive metabolic panel  Result Value Ref Range   Glucose 81 65 - 99 mg/dL   BUN 11 6 - 24 mg/dL   Creatinine, Ser 0.97 0.57 - 1.00 mg/dL   GFR calc non Af Amer 65 >59 mL/min/1.73   GFR calc Af Wyvonnia Lora  75 >59 mL/min/1.73   BUN/Creatinine Ratio 11 9 - 23   Sodium 140 134 - 144 mmol/L   Potassium 4.1 3.5 - 5.2 mmol/L   Chloride 103 96 - 106 mmol/L   CO2 20 20 - 29 mmol/L   Calcium 9.8 8.7 - 10.2 mg/dL   Total Protein 7.9 6.0 - 8.5 g/dL   Albumin 4.5 3.8 - 4.9 g/dL   Globulin, Total 3.4 1.5 - 4.5 g/dL   Albumin/Globulin Ratio 1.3 1.2 - 2.2   Bilirubin Total 0.4 0.0 - 1.2 mg/dL   Alkaline Phosphatase 101 48 - 121 IU/L   AST 31 0 - 40 IU/L   ALT 25 0 - 32 IU/L  Lipid Panel w/o Chol/HDL Ratio  Result Value Ref Range   Cholesterol, Total 197 100 - 199 mg/dL   Triglycerides 75 0 - 149 mg/dL   HDL 65 >50 mg/dL   VLDL Cholesterol Cal 14 5 - 40 mg/dL   LDL Chol Calc (NIH) 354 (H) 0 - 99 mg/dL  TSH  Result Value Ref Range   TSH 0.716 0.450 - 4.500 uIU/mL  UA/M  w/rflx Culture, Routine   Specimen: Urine   Urine  Result Value Ref Range   Specific Gravity, UA <1.005 (L) 1.005 - 1.030   pH, UA 5.5 5.0 - 7.5   Color, UA Yellow Yellow   Appearance Ur Clear Clear   Leukocytes,UA Negative Negative   Protein,UA Negative Negative/Trace   Glucose, UA Negative Negative   Ketones, UA Negative Negative   RBC, UA Trace (A) Negative   Bilirubin, UA Negative Negative   Urobilinogen, Ur 0.2 0.2 - 1.0 mg/dL   Nitrite, UA Negative Negative   Microscopic Examination See below:   Cytology - PAP  Result Value Ref Range   High risk HPV Positive (A)    HPV 16 Negative    HPV 18 / 45 Negative    Adequacy      Satisfactory for evaluation. The presence or absence of an   Adequacy      endocervical/transformation zone component cannot be determined because   Adequacy of atrophy.    Diagnosis      - Negative for intraepithelial lesion or malignancy (NILM)   Comment Normal Reference Range HPV - Negative    Comment Normal Reference Range HPV 16 18 45 -Negative       Assessment & Plan:   Problem List Items Addressed This Visit      Musculoskeletal and Integument   Discoid lupus    Followed by Rheumatology, continue current regimen per their recommendations        Other   Obesity    Trial wellbutrin, increase exercise and work on healthy eating habits      Relevant Orders   TSH (Completed)   Hyperlipemia - Primary    Recheck lipids, continue working on diet and exercise changes      Relevant Orders   Comprehensive metabolic panel (Completed)   Lipid Panel w/o Chol/HDL Ratio (Completed)    Other Visit Diagnoses    Annual physical exam       Relevant Orders   CBC with Differential/Platelet (Completed)   UA/M w/rflx Culture, Routine (Completed)   History of HPV infection       Per record review last pap 2018 HPV + without atypical cells. Repeat pap performed today, refer to GYN if still positive   Relevant Orders   Cytology - PAP (Completed)    Encounter for screening mammogram for malignant  neoplasm of breast       Relevant Orders   MM DIGITAL SCREENING BILATERAL       Follow up plan: Return in about 3 months (around 10/28/2019) for weight check.   LABORATORY TESTING:  - Pap smear: pap done  IMMUNIZATIONS:   - Tdap: Tetanus vaccination status reviewed: last tetanus booster within 10 years. - Influenza: Up to date  SCREENING: -Mammogram: Ordered today  - Colonoscopy: Up to date   PATIENT COUNSELING:   Advised to take 1 mg of folate supplement per day if capable of pregnancy.   Sexuality: Discussed sexually transmitted diseases, partner selection, use of condoms, avoidance of unintended pregnancy  and contraceptive alternatives.   Advised to avoid cigarette smoking.  I discussed with the patient that most people either abstain from alcohol or drink within safe limits (<=14/week and <=4 drinks/occasion for males, <=7/weeks and <= 3 drinks/occasion for females) and that the risk for alcohol disorders and other health effects rises proportionally with the number of drinks per week and how often a drinker exceeds daily limits.  Discussed cessation/primary prevention of drug use and availability of treatment for abuse.   Diet: Encouraged to adjust caloric intake to maintain  or achieve ideal body weight, to reduce intake of dietary saturated fat and total fat, to limit sodium intake by avoiding high sodium foods and not adding table salt, and to maintain adequate dietary potassium and calcium preferably from fresh fruits, vegetables, and low-fat dairy products.    stressed the importance of regular exercise  Injury prevention: Discussed safety belts, safety helmets, smoke detector, smoking near bedding or upholstery.   Dental health: Discussed importance of regular tooth brushing, flossing, and dental visits.    NEXT PREVENTATIVE PHYSICAL DUE IN 1 YEAR. Return in about 3 months (around 10/28/2019) for weight  check.

## 2019-07-29 LAB — LIPID PANEL W/O CHOL/HDL RATIO
Cholesterol, Total: 197 mg/dL (ref 100–199)
HDL: 65 mg/dL (ref >39)
LDL Chol Calc (NIH): 118 mg/dL — ABNORMAL HIGH (ref 0–99)
Triglycerides: 75 mg/dL (ref 0–149)
VLDL Cholesterol Cal: 14 mg/dL (ref 5–40)

## 2019-07-29 LAB — COMPREHENSIVE METABOLIC PANEL
ALT: 25 IU/L (ref 0–32)
AST: 31 IU/L (ref 0–40)
Albumin/Globulin Ratio: 1.3 (ref 1.2–2.2)
Albumin: 4.5 g/dL (ref 3.8–4.9)
Alkaline Phosphatase: 101 IU/L (ref 48–121)
BUN/Creatinine Ratio: 11 (ref 9–23)
BUN: 11 mg/dL (ref 6–24)
Bilirubin Total: 0.4 mg/dL (ref 0.0–1.2)
CO2: 20 mmol/L (ref 20–29)
Calcium: 9.8 mg/dL (ref 8.7–10.2)
Chloride: 103 mmol/L (ref 96–106)
Creatinine, Ser: 0.97 mg/dL (ref 0.57–1.00)
GFR calc Af Amer: 75 mL/min/{1.73_m2} (ref >59)
GFR calc non Af Amer: 65 mL/min/{1.73_m2} (ref >59)
Globulin, Total: 3.4 g/dL (ref 1.5–4.5)
Glucose: 81 mg/dL (ref 65–99)
Potassium: 4.1 mmol/L (ref 3.5–5.2)
Sodium: 140 mmol/L (ref 134–144)
Total Protein: 7.9 g/dL (ref 6.0–8.5)

## 2019-07-29 LAB — CBC WITH DIFFERENTIAL/PLATELET
Basophils Absolute: 0 10*3/uL (ref 0.0–0.2)
Basos: 0 %
EOS (ABSOLUTE): 0.2 10*3/uL (ref 0.0–0.4)
Eos: 3 %
Hematocrit: 40.5 % (ref 34.0–46.6)
Hemoglobin: 13.7 g/dL (ref 11.1–15.9)
Immature Grans (Abs): 0 10*3/uL (ref 0.0–0.1)
Immature Granulocytes: 0 %
Lymphocytes Absolute: 3.8 10*3/uL — ABNORMAL HIGH (ref 0.7–3.1)
Lymphs: 46 %
MCH: 31.1 pg (ref 26.6–33.0)
MCHC: 33.8 g/dL (ref 31.5–35.7)
MCV: 92 fL (ref 79–97)
Monocytes Absolute: 0.6 10*3/uL (ref 0.1–0.9)
Monocytes: 8 %
Neutrophils Absolute: 3.6 10*3/uL (ref 1.4–7.0)
Neutrophils: 43 %
Platelets: 183 10*3/uL (ref 150–450)
RBC: 4.41 x10E6/uL (ref 3.77–5.28)
RDW: 13.3 % (ref 11.7–15.4)
WBC: 8.3 10*3/uL (ref 3.4–10.8)

## 2019-07-29 LAB — TSH: TSH: 0.716 u[IU]/mL (ref 0.450–4.500)

## 2019-08-01 ENCOUNTER — Other Ambulatory Visit: Payer: Self-pay | Admitting: Family Medicine

## 2019-08-01 DIAGNOSIS — B977 Papillomavirus as the cause of diseases classified elsewhere: Secondary | ICD-10-CM

## 2019-08-01 LAB — CYTOLOGY - PAP
Comment: NEGATIVE
Comment: NEGATIVE
Diagnosis: NEGATIVE
HPV 16: NEGATIVE
HPV 18 / 45: NEGATIVE
High risk HPV: POSITIVE — AB

## 2019-08-04 ENCOUNTER — Other Ambulatory Visit: Payer: Self-pay | Admitting: Family Medicine

## 2019-08-04 NOTE — Assessment & Plan Note (Signed)
Recheck lipids, continue working on diet and exercise changes 

## 2019-08-04 NOTE — Assessment & Plan Note (Signed)
Trial wellbutrin, increase exercise and work on healthy eating habits

## 2019-08-04 NOTE — Assessment & Plan Note (Signed)
Followed by Rheumatology, continue current regimen per their recommendations 

## 2019-08-08 LAB — HM MAMMOGRAPHY

## 2019-09-01 ENCOUNTER — Ambulatory Visit (INDEPENDENT_AMBULATORY_CARE_PROVIDER_SITE_OTHER): Payer: Managed Care, Other (non HMO) | Admitting: Obstetrics and Gynecology

## 2019-09-01 ENCOUNTER — Encounter: Payer: Self-pay | Admitting: Obstetrics and Gynecology

## 2019-09-01 ENCOUNTER — Other Ambulatory Visit: Payer: Self-pay

## 2019-09-01 VITALS — BP 110/80 | Ht 61.5 in | Wt 221.0 lb

## 2019-09-01 DIAGNOSIS — R87810 Cervical high risk human papillomavirus (HPV) DNA test positive: Secondary | ICD-10-CM | POA: Diagnosis not present

## 2019-09-01 NOTE — Progress Notes (Signed)
Obstetrics & Gynecology Office Visit   Chief Complaint:  Chief Complaint  Patient presents with  . Colposcopy  . [employer] Employee    History of Present Illness:Stacey Marshall is a 59 y.o. woman who presents today for continued surveillance for history of dysplasia. Last pap obtained on 07/28/2019 revealed NILM HPV positive.  Prior pap 06/07/2016 also NILM HPV positive subtype 18/45 positive.  The patient is on plaquenil for discoid lupus.  Pap/Treatment History:  07/28/2019 NILM HPV positive 06/07/2016 NILM HPV positive (18 positive) 10/28/1991 NILM ([facility])  Review of Systems: Review of Systems  Constitutional: Negative.   Gastrointestinal: Negative.   Genitourinary: Negative.      Past Medical History:  Patient Active Problem List   Diagnosis Date Noted  . Hyperlipemia 06/23/2019  . Encounter for screening colonoscopy   . Allergic rhinitis 07/23/2018  . Obesity 07/19/2018  . 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    Past Surgical History:  Patient Active Problem List   Diagnosis Date Noted  . Hyperlipemia 06/23/2019  . Encounter for screening colonoscopy   . Allergic rhinitis 07/23/2018  . Obesity 07/19/2018  . 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    Gynecologic History: No LMP recorded (lmp unknown). (Menstrual status: Irregular Periods).  Obstetric History: No obstetric history on file.  Family History:  Family History  Problem Relation Age of Onset  . Diabetes Mother   . Hypertension Mother   . Diabetes Father   . Hypertension Father     Social History:  Social History   Socioeconomic History  . Marital status: Single    Spouse name: Not on file  . Number of children: Not on file  . Years of education: Not on file  . Highest education level: Not on file  Occupational History  . Not on file  Tobacco Use  . Smoking status: Never Smoker  . Smokeless tobacco: Never Used  Vaping Use  . Vaping Use: Never used  Substance and Sexual Activity  . Alcohol use: Yes     Alcohol/week: 2.0 - 3.0 standard drinks    Types: 2 - 3 Glasses of wine per week  . Drug use: No  . Sexual activity: Yes    Birth control/protection: None  Other Topics Concern  . Not on file  Social History Narrative  . Not on file   Social Determinants of Health   Financial Resource Strain:   . Difficulty of Paying Living Expenses:   Food Insecurity:   . Worried About Programme researcher, broadcasting/film/video in the Last Year:   . Barista in the Last Year:   Transportation Needs:   . Freight forwarder (Medical):   [name] Lack of Transportation (Non-Medical):   Physical Activity:   . Days of Exercise per Week:   . Minutes of Exercise per Session:   Stress:   . Feeling of Stress :   Social Connections:   . Frequency of Communication with Friends and Family:   . Frequency of Social Gatherings with Friends and Family:   . Attends Religious Services:   . Active Member of Clubs or Organizations:   . Attends Banker Meetings:   [name] Marital Status:   Intimate Partner Violence:   . Fear of Current or Ex-Partner:   . Emotionally Abused:   [name] Physically Abused:   . Sexually Abused:     Allergies:  Allergies  Allergen Reactions  . Bactrim [Sulfamethoxazole-Trimethoprim] Hives  . Saxenda [Liraglutide -Weight Management] Rash  Medications: Prior to Admission medications   Medication Sig Start Date End Date Taking? Authorizing Provider  buPROPion (WELLBUTRIN XL) 150 MG 24 hr tablet Take 1 tablet (150 mg total) by mouth daily. 07/28/19  Yes Particia Nearing, PA-C  estradiol (ESTRACE) 0.1 MG/GM vaginal cream INSERT 1 APPLICATORFUL (4  GRAMS) VAGINALLY TWICE  WEEKLY 09/19/18  Yes Particia Nearing, PA-C  fluticasone [facility]) 50 MCG/ACT nasal spray Place 2 sprays into both nostrils daily. 07/19/18  Yes Particia Nearing, PA-C  hydroxychloroquine (PLAQUENIL) 200 MG tablet Take 200 mg by mouth 2 (two) times daily.  05/17/16  Yes [provider]  meclizine  (ANTIVERT) 25 MG tablet Take 1 tablet (25 mg total) by mouth 3 (three) times daily as needed for dizziness. 06/23/19  Yes Particia Nearing, PA-C    Physical Exam Vitals:  Vitals:   09/01/19 1426  BP: 110/80   No LMP recorded (lmp unknown). (Menstrual status: Irregular Periods).  General: NAD HEENT: normocephalic, anicteric Pulmonary: No increased work of breathing Genitourinary:  External: Normal external female genitalia.  Normal urethral meatus, normal  Bartholin's and Skene's glands.    Vagina: Normal vaginal mucosa, no evidence of prolapse.    Cervix: Grossly normal in appearance, no bleeding  Uterus: Non-enlarged, mobile, normal contour.  No CMT  Adnexa: ovaries non-enlarged, no adnexal masses  Rectal: deferred  Lymphatic: no evidence of inguinal lymphadenopathy Extremities: no edema, erythema, or tenderness Neurologic: Grossly intact Psychiatric: mood appropriate, affect full  Female chaperone present for pelvic and breast  portions of the physical exam   GYNECOLOGY CLINIC COLPOSCOPY PROCEDURE NOTE  59 y.o. No obstetric history on file. here for colposcopy for NIL and HR HPV+  pap smear on 07/28/2019 with preceding pap 06/07/2016 also HPV positive. Discussed underlying role for HPV infection in the development of cervical dysplasia, its natural history and progression/regression, need for surveillance.  Is the patient  pregnant: No LMP: No LMP recorded (lmp unknown). (Menstrual status: Irregular Periods). Smoking status:  reports that she has never smoked. She has never used smokeless tobacco.  Patient given informed consent, signed copy in the chart, time out was performed.  The patient was position in dorsal lithotomy position. Speculum was placed the cervix was visualized.   After application of acetic acid colposcopic inspection of the cervix was undertaken.   Colposcopy adequate, full visualization of transformation zone: Yes no visible lesions; corresponding  biopsies obtained.   ECC specimen obtained:  Yes  All specimens were labeled and sent to pathology.   Patient was given post procedure instructions.  Will follow up pathology and manage accordingly.  Routine preventative health maintenance measures emphasized.       Assessment: 59 y.o. No obstetric history on file. follow up for NILM persistent HPV positive pap  Plan: Problem List Items Addressed This Visit    None    Visit Diagnoses    Cervical high risk HPV (human papillomavirus) test positive    -  Primary   Relevant Orders   Pathology ([facility])      - Colposcopy performed today   - I had a lengthly discussion with Hermione Havlicek Traynor  regarding the cause of dysplasia of the lower genital tract (including immunosuppression in the setting of HPV exposure and tobacco exposure). I explained the potential for progression to invasive malignancy, the recurrent nature of these lesions (and the need for close continued followup). Results of today's pap will dictate need for further evaluation and follow up per ASCCP guidelines.  -  We discussed that 80% of the population will have exposure to human papilloma virus (HPV) during their lifetime.  HPV is a large group of viruses, and are also the causative virus for common warts and genital warts.  The pap smear tests for 13 high risk HPV strains that have some association with cervical cancer, but do not cause visual lesion such as warts.  HPV type 16 and 18 have the highest association with cervical cancer.  The vast majority of HPV infections will be uncomplicated at clear spontaneously in 12-18 months in non-immunocompromised patients.  Patient with compromised immune systems, those taking immunosuppressive drugs, or smoker have shown to have a lower clearance rate and higher persistence of HPV infection.    Currently there are no FDA approved treatments to promote HPV clearance.  Gardasil vaccination is available to prevent HPV infection, but  this is only beneficial pre-exposure.  Abstaining from intercourse will not increase clearance  Lastly we stressed that if properly followed HPV should not lead to cervical cancer.   The goal of screening is to identify patient who develop precancerous lesions of the cervix and treat these prior to progression to frank cervical cancer.  The incidence of cervical cancer is 7 cases per 100,000 women in the [place] a year.  This relatively low rate is in part due to universal screening as well as vaccination efforts.    - She is comfortable with the plan and had her questions answered.  - Return in about 1 year (around 08/31/2020) for repeat pap.   Vena Austria, MD, [facility], [facility] 09/01/2019, 3:10 PM

## 2019-09-03 LAB — ANATOMIC PATHOLOGY REPORT

## 2019-09-15 ENCOUNTER — Other Ambulatory Visit: Payer: Self-pay | Admitting: Family Medicine

## 2019-09-15 MED ORDER — MECLIZINE HCL 25 MG PO TABS: 25.0000 mg | ORAL_TABLET | Freq: Three times a day (TID) | ORAL | 0 refills | Status: DC | PRN

## 2019-09-15 NOTE — Telephone Encounter (Signed)
Patient last seen 07/28/19.

## 2019-09-15 NOTE — Telephone Encounter (Addendum)
Pt request refill  meclizine (ANTIVERT) 25 MG tablet  [id number] [place], [place] - [address] Phone:  [phone]  Fax:  [phone]     Pt thinks her vertigo is coming back.  Pt declined appt. Wanted me to ask first.

## 2019-11-03 ENCOUNTER — Ambulatory Visit: Payer: Managed Care, Other (non HMO) | Admitting: Family Medicine

## 2019-11-03 ENCOUNTER — Other Ambulatory Visit: Payer: Self-pay

## 2019-11-03 ENCOUNTER — Encounter: Payer: Self-pay | Admitting: Nurse Practitioner

## 2019-11-03 ENCOUNTER — Ambulatory Visit: Payer: Managed Care, Other (non HMO) | Admitting: Nurse Practitioner

## 2019-11-03 VITALS — BP 129/78 | HR 64 | Temp 98.6°F | Resp 16 | Ht 61.5 in | Wt 216.6 lb

## 2019-11-03 DIAGNOSIS — Z23 Encounter for immunization: Secondary | ICD-10-CM

## 2019-11-03 DIAGNOSIS — Z6841 Body Mass Index (BMI) 40.0 and over, adult: Secondary | ICD-10-CM

## 2019-11-03 MED ORDER — BUPROPION HCL ER (XL) 150 MG PO TB24: 150.0000 mg | ORAL_TABLET | Freq: Every day | ORAL | 1 refills | Status: DC

## 2019-11-03 NOTE — Assessment & Plan Note (Addendum)
Ongoing.  Patient reports 6 lbs. Of weight loss since last visit; congratulated on weight loss.  Patient interested in weight loss clinic in [place] - will place referral today.  Form signed for work.  Encouraged small changes that are sustainable to help with moderate weight loss over time.  Patient appears to be very motivated to lose weight.  Follow up in 6 months or sooner if needs arise.

## 2019-11-03 NOTE — Progress Notes (Deleted)
Established patient visit   Patient: Stacey Marshall   DOB: 1960-11-08   59 y.o. Female  MRN: [id number] Visit Date: 11/03/2019  Today's healthcare provider: Valentino Nose, NP   Chief Complaint  Patient presents with  . Follow-up   Subjective    HPI  Follow up for weight  The patient was last seen for this 3 months ago. Changes made at last visit include started trial of wellbutrin, advised to increase exercise and healthy lifestyle changes. Patient reports she has joined a gym, and will try to get a Systems analyst. Patient requesting to be [facility] to [facility].   She reports excellent compliance with treatment. She feels that condition is Unchanged. She is not having side effects.   Wt Readings from Last 3 Encounters:  11/03/19 216 lb 9.6 oz (98.2 kg)  09/01/19 (!) 221 lb (100.2 kg)  07/28/19 222 lb 4 oz (100.8 kg)     -----------------------------------------------------------------------------------------    Patient Active Problem List   Diagnosis Date Noted  . Hyperlipemia 06/23/2019  . Encounter for screening colonoscopy   . Allergic rhinitis 07/23/2018  . Obesity 07/19/2018  . 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 Smoking status: Never Smoker  . Smokeless tobacco: Never Used  Vaping Use  . Vaping Use: Never used  Substance Use Topics  . Alcohol use: Yes    Alcohol/week: 2.0 - 3.0 standard drinks    Types: 2 - 3 Glasses of wine per week  . Drug use: No   Allergies  Allergen Reactions  . Bactrim [Sulfamethoxazole-Trimethoprim] Hives  . Saxenda [Liraglutide -Weight Management] Rash       Medications: Outpatient Medications Prior to Visit  Medication Sig  . buPROPion (WELLBUTRIN XL) 150 MG 24 hr tablet Take 1 tablet (150 mg total) by mouth daily.  [name] estradiol (ESTRACE) 0.1 MG/GM vaginal cream INSERT 1 APPLICATORFUL (4  GRAMS) VAGINALLY TWICE  WEEKLY  . fluticasone (FLONASE) 50 MCG/ACT nasal  spray Place 2 sprays into both nostrils daily.  . hydroxychloroquine (PLAQUENIL) 200 MG tablet Take 200 mg by mouth 2 (two) times daily.   . meclizine (ANTIVERT) 25 MG tablet Take 1 tablet (25 mg total) by mouth 3 (three) times daily as needed for dizziness.   No facility-administered medications prior to visit.    Review of Systems  Constitutional: Negative for activity change, appetite change and fatigue.  Respiratory: Negative for cough and shortness of breath.   Cardiovascular: Negative for chest pain, palpitations and leg swelling.    {Heme  Chem  Endocrine  Serology  Results Review (optional):23779::" "}  Objective    BP 129/78 (BP Location: Left Arm, Patient Position: Sitting, Cuff Size: Large)   Pulse 64   Temp 98.6 F (37 C) (Oral)   Resp 16   Ht 5' 1.5" (1.562 m)   Wt 216 lb 9.6 oz (98.2 kg)   SpO2 100%   BMI 40.26 kg/m  BP Readings from Last 3 Encounters:  11/03/19 129/78  09/01/19 110/80  07/28/19 134/87   Wt Readings from Last 3 Encounters:  11/03/19 216 lb 9.6 oz (98.2 kg)  09/01/19 (!) 221 lb (100.2 kg)  07/28/19 222 lb 4 oz (100.8 kg)      Physical Exam  ***  No results found for any visits on 11/03/19.  Assessment & Plan     ***  Return in about 2 months (around 01/03/2020).      {provider  attestation***:1}   Valentino Nose, NP  [facility] [phone] (phone) [phone] (fax)  [facility]

## 2019-11-03 NOTE — Progress Notes (Signed)
BP 129/78 (BP Location: Left Arm, Patient Position: Sitting, Cuff Size: Large)    Pulse 64    Temp 98.6 F (37 C) (Oral)    Resp 16    Ht 5' 1.5" (1.562 m)    Wt 216 lb 9.6 oz (98.2 kg)    SpO2 100%    BMI 40.26 kg/m    Subjective:    Patient ID: Stacey Marshall, female    DOB: October 04, 1960, 59 y.o.   MRN: [id number]  HPI: Meghan Warshawsky is a 59 y.o. female presenting for weight follow up.   Chief Complaint  Patient presents with   Follow-up   WEIGHT GAIN Patient reports she is struggling with her weight.  She reports her weight is up and down.  She recently joined a gym.  She is interested in having her metabolism tested.  Reports the Wellbutrin helps her feel calm.  Duration: years Previous attempts at weight loss: yes Complications of obesity: none so far Peak weight: 222 Weight loss goal: no goal in mind Weight loss to date: 6 lbs Requesting obesity pharmacotherapy: no Current weight loss supplements/medications: no Previous weight loss supplements/meds: no   Allergies  Allergen Reactions   Bactrim [Sulfamethoxazole-Trimethoprim] Hives   Saxenda [Liraglutide -Weight Management] Rash   Outpatient Encounter Medications as of 11/03/2019  Medication Sig   buPROPion (WELLBUTRIN XL) 150 MG 24 hr tablet Take 1 tablet (150 mg total) by mouth daily.   estradiol (ESTRACE) 0.1 MG/GM vaginal cream INSERT 1 APPLICATORFUL (4  GRAMS) VAGINALLY TWICE  WEEKLY   fluticasone (FLONASE) 50 MCG/ACT nasal spray Place 2 sprays into both nostrils daily.   hydroxychloroquine (PLAQUENIL) 200 MG tablet Take 200 mg by mouth 2 (two) times daily.    meclizine (ANTIVERT) 25 MG tablet Take 1 tablet (25 mg total) by mouth 3 (three) times daily as needed for dizziness.   [DISCONTINUED] buPROPion (WELLBUTRIN XL) 150 MG 24 hr tablet Take 1 tablet (150 mg total) by mouth daily.   No facility-administered encounter medications on file as of 11/03/2019.   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 Interim medical history since our last visit reviewed. Allergies and medications reviewed and updated.  Review of Systems  Constitutional: Positive for fatigue and unexpected weight change. Negative for activity change, appetite change and fever.  HENT: Negative.   Eyes: Negative.   Respiratory: Negative.   Gastrointestinal: Negative.   Genitourinary: Negative.   Skin: Negative.     Per HPI unless specifically indicated above     Objective:    BP 129/78 (BP Location: Left Arm, Patient Position: Sitting, Cuff Size: Large)    Pulse 64    Temp 98.6 F (37 C) (Oral)    Resp 16    Ht 5' 1.5" (1.562 m)    Wt 216 lb 9.6 oz (98.2 kg)    SpO2 100%    BMI 40.26 kg/m   Wt Readings from Last 3 Encounters:  11/03/19 216 lb 9.6 oz (98.2 kg)  09/01/19 (!) 221 lb (100.2 kg)  07/28/19 222 lb 4 oz (100.8 kg)    Physical Exam Vitals and nursing note reviewed.  Constitutional:  General: She is not in acute distress.    Appearance: Normal appearance. She is obese. She is not toxic-appearing.  HENT:     Head: Normocephalic and atraumatic.     Right Ear: External ear normal.     Left Ear: External ear normal.  Eyes:     General: No scleral icterus.    Extraocular Movements: Extraocular movements intact.  Cardiovascular:     Rate and Rhythm: Normal rate and regular rhythm.     Heart sounds: Normal heart sounds. No murmur heard.   Pulmonary:     Effort: Pulmonary effort is normal. No respiratory distress.     Breath sounds: Normal breath sounds. No wheezing, rhonchi or rales.  Abdominal:     General: Abdomen is flat. Bowel sounds are normal.     Palpations:  Abdomen is soft.     Tenderness: There is no abdominal tenderness.  Musculoskeletal:        General: No swelling or tenderness. Normal range of motion.  Skin:    General: Skin is warm and dry.     Capillary Refill: Capillary refill takes less than 2 seconds.     Coloration: Skin is not jaundiced.     Findings: No bruising.  Neurological:     Mental Status: She is alert and oriented to person, place, and time.     Motor: No weakness.     Gait: Gait normal.  Psychiatric:        Mood and Affect: Mood normal.        Behavior: Behavior normal.        Thought Content: Thought content normal.        Judgment: Judgment normal.       Assessment & Plan:   Problem List Items Addressed This Visit      Other   Obesity - Primary    Ongoing.  Patient reports 6 lbs. Of weight loss since last visit; congratulated on weight loss.  Patient interested in weight loss clinic in [place] - will place referral today.  Form signed for work.  Encouraged small changes that are sustainable to help with moderate weight loss over time.  Patient appears to be very motivated to lose weight.  Follow up in 6 months or sooner if needs arise.      Relevant Orders   Amb Ref to [facility]    Other Visit Diagnoses    Need for influenza vaccination       Relevant Orders   Flu Vaccine QUAD 36+ mos IM (Completed)   Need for shingles vaccine       Relevant Orders   Varicella-zoster vaccine IM (Completed)       Follow up plan: Return in about 6 months (around 05/02/2020) for f/u weight loss.

## 2019-11-03 NOTE — Patient Instructions (Signed)
Influenza (Flu) Vaccine (Inactivated or Recombinant): What You Need to Know 1. Why get vaccinated? Influenza vaccine can prevent influenza (flu). Flu is a contagious disease that spreads around the [place] every year, usually between October and May. Anyone can get the flu, but it is more dangerous for some people. Infants and young children, people 58 years of age and older, pregnant women, and people with certain health conditions or a weakened immune system are at greatest risk of flu complications. Pneumonia, bronchitis, sinus infections and ear infections are examples of flu-related complications. If you have a medical condition, such as heart disease, cancer or diabetes, flu can make it worse. Flu can cause fever and chills, sore throat, muscle aches, fatigue, cough, headache, and runny or stuffy nose. Some people may have vomiting and diarrhea, though this is more common in children than adults. Each year thousands of people in the [place] die from flu, and many more are hospitalized. Flu vaccine prevents millions of illnesses and flu-related visits to the doctor each year. 2. Influenza vaccine CDC recommends everyone 57 months of age and older get vaccinated every flu season. Children 6 months through 2 years of age may need 2 doses during a single flu season. Everyone else needs only 1 dose each flu season. It takes about 2 weeks for protection to develop after vaccination. There are many flu viruses, and they are always changing. Each year a new flu vaccine is made to protect against three or four viruses that are likely to cause disease in the upcoming flu season. Even when the vaccine doesn't exactly match these viruses, it may still provide some protection. Influenza vaccine does not cause flu. Influenza vaccine may be given at the same time as other vaccines. 3. Talk with your health care provider Tell your vaccine provider if the person getting the vaccine:  Has had an  allergic reaction after a previous dose of influenza vaccine, or has any severe, life-threatening allergies.  Has ever had Guillain-Barr Syndrome (also called GBS). In some cases, your health care provider may decide to postpone influenza vaccination to a future visit. People with minor illnesses, such as a cold, may be vaccinated. People who are moderately or severely ill should usually wait until they recover before getting influenza vaccine. Your health care provider can give you more information. 4. Risks of a vaccine reaction  Soreness, redness, and swelling where shot is given, fever, muscle aches, and headache can happen after influenza vaccine.  There may be a very small increased risk of Guillain-Barr Syndrome (GBS) after inactivated influenza vaccine (the flu shot). Young children who get the flu shot along with pneumococcal vaccine (PCV13), and/or DTaP vaccine at the same time might be slightly more likely to have a seizure caused by fever. Tell your health care provider if a child who is getting flu vaccine has ever had a seizure. People sometimes faint after medical procedures, including vaccination. Tell your provider if you feel dizzy or have vision changes or ringing in the ears. As with any medicine, there is a very remote chance of a vaccine causing a severe allergic reaction, other serious injury, or death. 5. What if there is a serious problem? An allergic reaction could occur after the vaccinated person leaves the clinic. If you see signs of a severe allergic reaction (hives, swelling of the face and throat, difficulty breathing, a fast heartbeat, dizziness, or weakness), call 9-1-1 and get the person to the nearest hospital. For other signs that  concern you, call your health care provider. Adverse reactions should be reported to the Vaccine Adverse Event Reporting System (VAERS). Your health care provider will usually file this report, or you can do it yourself. Visit the  VAERS website at www.vaers.LAgents.no or call (671) 033-0404.VAERS is only for reporting reactions, and VAERS staff do not give medical advice. 6. The National Vaccine Injury Compensation Program The [employer] Vaccine Injury Compensation Program (VICP) is a federal program that was created to compensate people who may have been injured by certain vaccines. Visit the VICP website at SpiritualWord.at or call 540-338-5624 to learn about the program and about filing a claim. There is a time limit to file a claim for compensation. 7. How can I learn more?  Ask your healthcare provider.  Call your local or state health department.  Contact the Centers for Disease Control and Prevention (CDC): ? Call (229)213-0325 (1-800-CDC-INFO) or ? Visit CDC's BiotechRoom.com.cy Vaccine Information Statement (Interim) Inactivated Influenza Vaccine (09/20/2017) This information is not intended to replace advice given to you by your health care provider. Make sure you discuss any questions you have with your health care provider. Document Revised: 05/14/2018 Document Reviewed: 09/24/2017 Elsevier Patient Education  2020 Elsevier Inc.  Recombinant Zoster (Shingles) Vaccine: What You Need to Know 1. Why get vaccinated? Recombinant zoster (shingles) vaccine can prevent shingles. Shingles (also called herpes zoster, or just zoster) is a painful skin rash, usually with blisters. In addition to the rash, shingles can cause fever, headache, chills, or upset stomach. More rarely, shingles can lead to pneumonia, hearing problems, blindness, brain inflammation (encephalitis), or death. The most common complication of shingles is long-term nerve pain called postherpetic neuralgia (PHN). PHN occurs in the areas where the shingles rash was, even after the rash clears up. It can last for months or years after the rash goes away. The pain from PHN can be severe and debilitating. About 10 to 18% of people who get  shingles will experience PHN. The risk of PHN increases with age. An older adult with shingles is more likely to develop PHN and have longer lasting and more severe pain than a younger person with shingles. Shingles is caused by the varicella zoster virus, the same virus that causes chickenpox. After you have chickenpox, the virus stays in your body and can cause shingles later in life. Shingles cannot be passed from one person to another, but the virus that causes shingles can spread and cause chickenpox in someone who had never had chickenpox or received chickenpox vaccine. 2. Recombinant shingles vaccine Recombinant shingles vaccine provides strong protection against shingles. By preventing shingles, recombinant shingles vaccine also protects against PHN. Recombinant shingles vaccine is the preferred vaccine for the prevention of shingles. However, a different vaccine, live shingles vaccine, may be used in some circumstances. The recombinant shingles vaccine is recommended for adults 50 years and older without serious immune problems. It is given as a two-dose series. This vaccine is also recommended for people who have already gotten another type of shingles vaccine, the live shingles vaccine. There is no live virus in this vaccine. Shingles vaccine may be given at the same time as other vaccines. 3. Talk with your health care provider Tell your vaccine provider if the person getting the vaccine:  Has had an allergic reaction after a previous dose of recombinant shingles vaccine, or has any severe, life-threatening allergies.  Is pregnant or breastfeeding.  Is currently experiencing an episode of shingles. In some cases, your health care provider may  decide to postpone shingles vaccination to a future visit. People with minor illnesses, such as a cold, may be vaccinated. People who are moderately or severely ill should usually wait until they recover before getting recombinant shingles  vaccine. Your health care provider can give you more information. 4. Risks of a vaccine reaction  A sore arm with mild or moderate pain is very common after recombinant shingles vaccine, affecting about 80% of vaccinated people. Redness and swelling can also happen at the site of the injection.  Tiredness, muscle pain, headache, shivering, fever, stomach pain, and nausea happen after vaccination in more than half of people who receive recombinant shingles vaccine. In clinical trials, about 1 out of 6 people who got recombinant zoster vaccine experienced side effects that prevented them from doing regular activities. Symptoms usually went away on their own in 2 to 3 days. You should still get the second dose of recombinant zoster vaccine even if you had one of these reactions after the first dose. People sometimes faint after medical procedures, including vaccination. Tell your provider if you feel dizzy or have vision changes or ringing in the ears. As with any medicine, there is a very remote chance of a vaccine causing a severe allergic reaction, other serious injury, or death. 5. What if there is a serious problem? An allergic reaction could occur after the vaccinated person leaves the clinic. If you see signs of a severe allergic reaction (hives, swelling of the face and throat, difficulty breathing, a fast heartbeat, dizziness, or weakness), call 9-1-1 and get the person to the nearest hospital. For other signs that concern you, call your health care provider. Adverse reactions should be reported to the Vaccine Adverse Event Reporting System (VAERS). Your health care provider will usually file this report, or you can do it yourself. Visit the VAERS website at www.vaers.LAgents.no or call (662) 354-9983. VAERS is only for reporting reactions, and VAERS staff do not give medical advice. 6. How can I learn more?  Ask your health care provider.  Call your local or state health department.  Contact  the Centers for Disease Control and Prevention (CDC): ? Call 734-328-7684 (1-800-CDC-INFO) or ? Visit CDC's website at PicCapture.uy Vaccine Information Statement Recombinant Zoster Vaccine (12/05/2017) This information is not intended to replace advice given to you by your health care provider. Make sure you discuss any questions you have with your health care provider. Document Revised: 05/14/2018 Document Reviewed: 08/29/2017 Elsevier Patient Education  2020 [employer].

## 2019-11-26 ENCOUNTER — Encounter (INDEPENDENT_AMBULATORY_CARE_PROVIDER_SITE_OTHER): Payer: Self-pay | Admitting: Bariatrics

## 2019-11-26 ENCOUNTER — Ambulatory Visit (INDEPENDENT_AMBULATORY_CARE_PROVIDER_SITE_OTHER): Payer: Managed Care, Other (non HMO) | Admitting: Bariatrics

## 2019-11-26 ENCOUNTER — Other Ambulatory Visit: Payer: Self-pay

## 2019-11-26 VITALS — BP 128/80 | HR 73 | Temp 98.0°F | Ht 61.0 in | Wt 214.0 lb

## 2019-11-26 DIAGNOSIS — Z1331 Encounter for screening for depression: Secondary | ICD-10-CM | POA: Diagnosis not present

## 2019-11-26 DIAGNOSIS — R7309 Other abnormal glucose: Secondary | ICD-10-CM

## 2019-11-26 DIAGNOSIS — L930 Discoid lupus erythematosus: Secondary | ICD-10-CM | POA: Diagnosis not present

## 2019-11-26 DIAGNOSIS — Z9189 Other specified personal risk factors, not elsewhere classified: Secondary | ICD-10-CM | POA: Diagnosis not present

## 2019-11-26 DIAGNOSIS — E559 Vitamin D deficiency, unspecified: Secondary | ICD-10-CM | POA: Diagnosis not present

## 2019-11-26 DIAGNOSIS — R0602 Shortness of breath: Secondary | ICD-10-CM

## 2019-11-26 DIAGNOSIS — M069 Rheumatoid arthritis, unspecified: Secondary | ICD-10-CM

## 2019-11-26 DIAGNOSIS — R5383 Other fatigue: Secondary | ICD-10-CM | POA: Diagnosis not present

## 2019-11-26 DIAGNOSIS — Z0289 Encounter for other administrative examinations: Secondary | ICD-10-CM

## 2019-11-26 DIAGNOSIS — Z6841 Body Mass Index (BMI) 40.0 and over, adult: Secondary | ICD-10-CM

## 2019-11-27 ENCOUNTER — Encounter (INDEPENDENT_AMBULATORY_CARE_PROVIDER_SITE_OTHER): Payer: Self-pay | Admitting: Bariatrics

## 2019-11-27 DIAGNOSIS — E559 Vitamin D deficiency, unspecified: Secondary | ICD-10-CM | POA: Insufficient documentation

## 2019-11-27 DIAGNOSIS — R7303 Prediabetes: Secondary | ICD-10-CM

## 2019-11-27 HISTORY — DX: Vitamin D deficiency, unspecified: E55.9

## 2019-11-27 HISTORY — DX: Prediabetes: R73.03

## 2019-11-27 LAB — HEMOGLOBIN A1C
Est. average glucose Bld gHb Est-mCnc: 117 mg/dL
Hgb A1c MFr Bld: 5.7 % — ABNORMAL HIGH (ref 4.8–5.6)

## 2019-11-27 LAB — INSULIN, RANDOM: INSULIN: 22.9 u[IU]/mL (ref 2.6–24.9)

## 2019-11-27 LAB — VITAMIN D 25 HYDROXY (VIT D DEFICIENCY, FRACTURES): Vit D, 25-Hydroxy: 10.6 ng/mL — ABNORMAL LOW (ref 30.0–100.0)

## 2019-11-27 NOTE — Progress Notes (Signed)
Dear Cathlean Marseilles, NP,   Thank you for referring Stacey Marshall to our clinic. The following note includes my evaluation and treatment recommendations.  Chief Complaint:   OBESITY Stacey Marshall (MR# [id number]) is a 59 y.o. female who presents for evaluation and treatment of obesity and related comorbidities. Current BMI is Body mass index is 40.43 kg/m.Stacey Marshall has been struggling with her weight for many years and has been unsuccessful in either losing weight, maintaining weight loss, or reaching her healthy weight goal.  Stacey Marshall is currently in the action stage of change and ready to dedicate time achieving and maintaining a healthier weight. Stacey Marshall is interested in becoming our patient and working on intensive lifestyle modifications including (but not limited to) diet and exercise for weight loss.  Stacey Marshall likes to [employer]. She denies being a "picky eater." She skips meals.  Stacey Marshall's habits were reviewed today and are as follows: her desired weight loss is 64 lbs, she started gaining weight after the age of 89, her heaviest weight ever was 225 pounds and she skips meals frequently.  Depression Screen Stacey Marshall's Food and Mood (modified PHQ-9) score was 6.  Depression screen PHQ 2/9 11/26/2019  Decreased Interest 1  Down, Depressed, Hopeless 1  PHQ - 2 Score 2  Altered sleeping 2  Tired, decreased energy 2  Change in appetite 0  Feeling bad or failure about yourself  0  Trouble concentrating 0  Moving slowly or fidgety/restless 0  Suicidal thoughts 0  PHQ-9 Score 6  Difficult doing work/chores Not difficult at all   Subjective:   Other fatigue. Bob denies daytime somnolence and denies waking up still tired. Stacey Marshall generally gets 4-5 hours of sleep per night, and states that she does not sleep well most nights. Snoring is present. Apneic episodes are not present. Epworth Sleepiness Score is 3.  SOB (shortness of breath) on exertion. Stacey Marshall notes increasing shortness of  breath with exercising and seems to be worsening over time with weight gain. She notes getting out of breath sooner with activity than she used to. This has gotten worse recently. Stacey Marshall denies shortness of breath at rest or orthopnea.  Rheumatoid arthritis, involving unspecified site, unspecified whether rheumatoid factor present (HCC). Stacey Marshall endorses joint and back pain.  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 She takes hydroxychloroquine.   Vitamin D deficiency. Stacey Marshall is not on Vitamin D supplementation.   Elevated glucose. Stacey Marshall reports a history of diabetes. She has a strong family history of hypertension.  Depression screening. Stacey Marshall had a mildly positive depression screen with a PHQ-9 score of 6.  At risk for activity intolerance. Stacey Marshall is at risk of exercise intolerance due to rheumatoid arthritis, joint pain, and obesity.  Assessment/Plan:   Other fatigue. Stacey Marshall does feel that her weight is causing her energy to be lower than it should be. Fatigue may be related to obesity, depression or many other causes. Labs will be ordered, and in the meanwhile, Abreanna will focus on self care including making healthy food choices, increasing physical activity and focusing on stress reduction.    SOB (shortness of breath) on exertion. Stacey Marshall does feel that she gets out of breath more easily that she used to when she exercises. Stacey Marshall's shortness of breath appears to be obesity related and exercise induced. She has agreed to work on weight loss and gradually increase exercise to treat her exercise induced shortness of breath. Will continue to monitor closely.  Rheumatoid arthritis, involving unspecified site, unspecified whether  rheumatoid factor present (HCC). [name] will see the rheumatologist PRN.  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  Vitamin D deficiency. Low Vitamin D level contributes to fatigue and are associated with obesity, breast, and colon  cancer. VITAMIN D 25 Hydroxy (Vit-D Deficiency, Fractures) level will be checked today.   Elevated glucose.  Insulin, random, Hemoglobin A1c levels will be checked today.  Depression screening. Stacey Marshall had a positive depression screening. Depression is commonly associated with obesity and often results in emotional eating behaviors. We will monitor this closely and work on CBT to help improve the non-hunger eating patterns. Referral to Psychology may be required if no improvement is seen as she continues in our clinic.  At risk for activity intolerance. Stacey Marshall was given approximately 15 minutes of exercise intolerance counseling today. She is 59 y.o. female and has risk factors exercise intolerance including obesity. We discussed intensive lifestyle modifications today with an emphasis on specific weight loss instructions and strategies. Stacey Marshall will slowly increase activity as tolerated.  Repetitive spaced learning was employed today to elicit superior memory formation and behavioral change.  Class 3 severe obesity due to excess calories without serious comorbidity with body mass index (BMI) of 40.0 to 44.9 in adult [facility]).  Stacey Marshall is currently in the action stage of change and her goal is to continue with weight loss efforts. I recommend Stacey Marshall begin the structured treatment plan as follows:  She has agreed to the Category 2 Plan + 100 calories.  She will work on meal planning and intentional eating.   Exercise goals: All adults should avoid inactivity. Some physical activity is better than none, and adults who participate in any amount of physical activity gain some health benefits.   Behavioral modification strategies: increasing lean protein intake, decreasing simple carbohydrates, increasing vegetables, increasing water intake, decreasing eating out, no skipping meals, meal planning and cooking strategies, keeping healthy foods in the home and planning for success.  She was informed of the  importance of frequent follow-up visits to maximize her success with intensive lifestyle modifications for her multiple health conditions. She was informed we would discuss her lab results at her next visit unless there is a critical issue that needs to be addressed sooner. Zamani agreed to keep her next visit at the agreed upon time to discuss these results.  Objective:   Blood pressure 128/80, pulse 73, temperature 98 F (36.7 C), height 5\' 1"  (1.549 m), weight 214 lb (97.1 kg), SpO2 99 %. Body mass index is 40.43 kg/m.  EKG: Normal sinus rhythm, rate 77 BPM. Within normal limits.  Indirect Calorimeter completed today shows a VO2 of 272 and a REE of 1890.  Her calculated basal metabolic rate is thus her basal metabolic rate is better than expected.  General: Cooperative, alert, well developed, in no acute distress. HEENT: Conjunctivae and lids unremarkable. Cardiovascular: Regular rhythm.  Lungs: Normal work of breathing. Neurologic: No focal deficits.   Lab Results  Component Value Date   CREATININE 0.97 07/28/2019   BUN 11 07/28/2019   NA 140 07/28/2019   K 4.1 07/28/2019   CL 103 07/28/2019   CO2 20 07/28/2019   Lab Results  Component Value Date   ALT 25 07/28/2019   AST 31 07/28/2019   ALKPHOS 101 07/28/2019   BILITOT 0.4 07/28/2019   Lab Results  Component Value Date   TSH 0.716 07/28/2019   Lab Results  Component Value Date   CHOL 197 07/28/2019   HDL  65 07/28/2019   LDLCALC 118 (H) 07/28/2019   TRIG 75 07/28/2019   CHOLHDL 3.2 06/11/2017   Lab Results  Component Value Date   WBC 8.3 07/28/2019   HGB 13.7 07/28/2019   HCT 40.5 07/28/2019   MCV 92 07/28/2019   PLT 183 07/28/2019   No results found for: IRON, TIBC, FERRITIN  Attestation Statements:   Reviewed by clinician on day of visit: allergies, medications, problem list, medical history, surgical history, family history, social history, and previous encounter notes.  Fernanda Drum, am acting  as Energy manager for [employer], DO   I have reviewed the above documentation for accuracy and completeness, and I agree with the above. Corinna Capra, DO

## 2019-12-08 ENCOUNTER — Encounter (INDEPENDENT_AMBULATORY_CARE_PROVIDER_SITE_OTHER): Payer: Self-pay | Admitting: Bariatrics

## 2019-12-08 ENCOUNTER — Ambulatory Visit (INDEPENDENT_AMBULATORY_CARE_PROVIDER_SITE_OTHER): Payer: Managed Care, Other (non HMO) | Admitting: Family Medicine

## 2019-12-10 ENCOUNTER — Ambulatory Visit (INDEPENDENT_AMBULATORY_CARE_PROVIDER_SITE_OTHER): Payer: Managed Care, Other (non HMO) | Admitting: Bariatrics

## 2019-12-10 ENCOUNTER — Other Ambulatory Visit: Payer: Self-pay

## 2019-12-10 ENCOUNTER — Encounter (INDEPENDENT_AMBULATORY_CARE_PROVIDER_SITE_OTHER): Payer: Self-pay | Admitting: Bariatrics

## 2019-12-10 VITALS — BP 125/82 | HR 98 | Temp 99.7°F | Ht 61.0 in | Wt 208.0 lb

## 2019-12-10 DIAGNOSIS — Z6839 Body mass index (BMI) 39.0-39.9, adult: Secondary | ICD-10-CM | POA: Diagnosis not present

## 2019-12-10 DIAGNOSIS — Z9189 Other specified personal risk factors, not elsewhere classified: Secondary | ICD-10-CM | POA: Diagnosis not present

## 2019-12-10 DIAGNOSIS — R7303 Prediabetes: Secondary | ICD-10-CM | POA: Diagnosis not present

## 2019-12-10 DIAGNOSIS — E559 Vitamin D deficiency, unspecified: Secondary | ICD-10-CM | POA: Diagnosis not present

## 2019-12-10 MED ORDER — VITAMIN D (ERGOCALCIFEROL) 1.25 MG (50000 UNIT) PO CAPS: 50000.0000 [IU] | ORAL_CAPSULE | ORAL | 0 refills | Status: DC

## 2019-12-10 NOTE — Progress Notes (Signed)
Chief Complaint:   OBESITY Stacey Marshall is here to discuss her progress with her obesity treatment plan along with follow-up of her obesity related diagnoses. Stacey Marshall is on the Category 2 Plan + 100 calories and states she is following her eating plan approximately 80% of the time. Stacey Marshall states she is exercising 0 minutes 0 times per week.  Today's visit was #: 2 Starting weight: 214 lbs Starting date: 11/26/2019 Today's weight: 208 lbs Today's date: 12/10/2019 Total lbs lost to date: 6 Total lbs lost since last in-office visit: 6  Interim History: Stacey Marshall is down 6 lbs from her first visit. She states she struggled slightly on the weekends.  Subjective:   Vitamin D deficiency. No nausea, vomiting, or muscle weakness.    Ref. Range 11/26/2019 10:41  Vitamin D, 25-Hydroxy Latest Ref Range: 30.0 - 100.0 ng/mL 10.6 (L)   Prediabetes. Stacey Marshall has a diagnosis of prediabetes based on her elevated HgA1c and was informed this puts her at greater risk of developing diabetes. She continues to work on diet and exercise to decrease her risk of diabetes. She denies nausea or hypoglycemia. Stacey Marshall is on no medication.  Lab Results  Component Value Date   HGBA1C 5.7 (H) 11/26/2019   Lab Results  Component Value Date   INSULIN 22.9 11/26/2019   At risk of diabetes mellitus. Nga is at higher than average risk for developing diabetes due to prediabetes.  Assessment/Plan:   Vitamin D deficiency. Low Vitamin D level contributes to fatigue and are associated with obesity, breast, and colon cancer. She was given a prescription for Vitamin D, Ergocalciferol, (DRISDOL) 1.25 MG (50000 UNIT) CAPS capsule every week #12 with 0 refills and will follow-up for routine testing of Vitamin D, at least 2-3 times per year to avoid over-replacement.   Prediabetes. Stacey Marshall will continue to work on weight loss, exercise, increasing healthy fats and protein, and decreasing simple carbohydrates to help decrease  the risk of diabetes.   At risk of diabetes mellitus. Stacey Marshall was given approximately 15 minutes of diabetes education and counseling today. We discussed intensive lifestyle modifications today with an emphasis on weight loss as well as increasing exercise and decreasing simple carbohydrates in her diet. We also reviewed medication options with an emphasis on risk versus benefit of those discussed.   Repetitive spaced learning was employed today to elicit superior memory formation and behavioral change.  Class 2 severe obesity with serious comorbidity and body mass index (BMI) of 39.0 to 39.9 in adult, unspecified obesity type (HCC).  Stacey Marshall is currently in the action stage of change. As such, her goal is to continue with weight loss efforts. She has agreed to the Category 2 Plan + 100 calories.   She will work on meal planning, intentional eating, and increasing her water intake.   We reviewed with the patient labs including Vitamin D, A1c, and insulin.  Handout was provided on Protein Equivalents.  Exercise goals: All adults should avoid inactivity. Some physical activity is better than none, and adults who participate in any amount of physical activity gain some health benefits.  Behavioral modification strategies: increasing lean protein intake, decreasing simple carbohydrates, increasing vegetables, increasing water intake, decreasing eating out, no skipping meals, meal planning and cooking strategies, keeping healthy foods in the home and planning for success.  Stacey Marshall has agreed to follow-up with our clinic in 2 weeks. She was informed of the importance of frequent follow-up visits to maximize her success with intensive lifestyle modifications  for her multiple health conditions.   Objective:   Blood pressure 125/82, pulse 98, temperature 99.7 F (37.6 C), height 5\' 1"  (1.549 m), weight 208 lb (94.3 kg), SpO2 98 %. Body mass index is 39.3 kg/m.  General: Cooperative, alert, well  developed, in no acute distress. HEENT: Conjunctivae and lids unremarkable. Cardiovascular: Regular rhythm.  Lungs: Normal work of breathing. Neurologic: No focal deficits.   Lab Results  Component Value Date   CREATININE 0.97 07/28/2019   BUN 11 07/28/2019   NA 140 07/28/2019   K 4.1 07/28/2019   CL 103 07/28/2019   CO2 20 07/28/2019   Lab Results  Component Value Date   ALT 25 07/28/2019   AST 31 07/28/2019   ALKPHOS 101 07/28/2019   BILITOT 0.4 07/28/2019   Lab Results  Component Value Date   HGBA1C 5.7 (H) 11/26/2019   Lab Results  Component Value Date   INSULIN 22.9 11/26/2019   Lab Results  Component Value Date   TSH 0.716 07/28/2019   Lab Results  Component Value Date   CHOL 197 07/28/2019   HDL 65 07/28/2019   LDLCALC 118 (H) 07/28/2019   TRIG 75 07/28/2019   CHOLHDL 3.2 06/11/2017   Lab Results  Component Value Date   WBC 8.3 07/28/2019   HGB 13.7 07/28/2019   HCT 40.5 07/28/2019   MCV 92 07/28/2019   PLT 183 07/28/2019   No results found for: IRON, TIBC, FERRITIN  Attestation Statements:   Reviewed by clinician on day of visit: allergies, medications, problem list, medical history, surgical history, family history, social history, and previous encounter notes.  07/30/2019, am acting as Fernanda Drum for Energy manager, DO   I have reviewed the above documentation for accuracy and completeness, and I agree with the above. [employer], DO

## 2019-12-24 ENCOUNTER — Ambulatory Visit (INDEPENDENT_AMBULATORY_CARE_PROVIDER_SITE_OTHER): Payer: Managed Care, Other (non HMO) | Admitting: Bariatrics

## 2019-12-24 ENCOUNTER — Other Ambulatory Visit: Payer: Self-pay

## 2019-12-24 ENCOUNTER — Encounter (INDEPENDENT_AMBULATORY_CARE_PROVIDER_SITE_OTHER): Payer: Self-pay | Admitting: Bariatrics

## 2019-12-24 VITALS — BP 119/84 | HR 84 | Temp 98.0°F | Ht 61.0 in | Wt 209.0 lb

## 2019-12-24 DIAGNOSIS — Z6839 Body mass index (BMI) 39.0-39.9, adult: Secondary | ICD-10-CM | POA: Diagnosis not present

## 2019-12-24 DIAGNOSIS — R7303 Prediabetes: Secondary | ICD-10-CM | POA: Diagnosis not present

## 2019-12-24 DIAGNOSIS — E7849 Other hyperlipidemia: Secondary | ICD-10-CM

## 2019-12-29 ENCOUNTER — Encounter (INDEPENDENT_AMBULATORY_CARE_PROVIDER_SITE_OTHER): Payer: Self-pay | Admitting: Bariatrics

## 2019-12-29 NOTE — Progress Notes (Signed)
Chief Complaint:   OBESITY Stacey Marshall is here to discuss her progress with her obesity treatment plan along with follow-up of her obesity related diagnoses. Stacey Marshall is on the Category 2 Plan and states she is following her eating plan approximately 50% of the time. Stacey Marshall states she is exercising 0 minutes 0 times per week.  Today's visit was #: 3 Starting weight: 214 lbs Starting date: 11/26/2019 Today's weight: 209 lbs Today's date: 12/24/2019 Total lbs lost to date: 5 Total lbs lost since last in-office visit: 0  Interim History: Stacey Marshall is up 1 lb, but has done well overall. She has not been eating all of her meals, but reports drinking more water.  Subjective:   Prediabetes. Stacey Marshall has a diagnosis of prediabetes based on her elevated HgA1c and was informed this puts her at greater risk of developing diabetes. She continues to work on diet and exercise to decrease her risk of diabetes. She denies nausea or hypoglycemia. Stacey Marshall is on no medications.  Lab Results  Component Value Date   HGBA1C 5.7 (H) 11/26/2019   Lab Results  Component Value Date   INSULIN 22.9 11/26/2019   Other hyperlipidemia. Stacey Marshall is on no medication.   Lab Results  Component Value Date   CHOL 197 07/28/2019   HDL 65 07/28/2019   LDLCALC 118 (H) 07/28/2019   TRIG 75 07/28/2019   CHOLHDL 3.2 06/11/2017   Lab Results  Component Value Date   ALT 25 07/28/2019   AST 31 07/28/2019   ALKPHOS 101 07/28/2019   BILITOT 0.4 07/28/2019   The 10-year ASCVD risk score Denman George DC Jr., et al., 2013) is: 2.7%   Values used to calculate the score:     Age: 59 years     Sex: Female     Is Non-Hispanic African American: Yes     Diabetic: No     Tobacco smoker: No     Systolic Blood Pressure: 119 mmHg     Is BP treated: No     HDL Cholesterol: 65 mg/dL     Total Cholesterol: 197 mg/dL  Assessment/Plan:   Prediabetes. Stacey Marshall will continue to work on weight loss, exercise, and decreasing simple  carbohydrates and refined sweets to help decrease the risk of diabetes.   Other hyperlipidemia. Cardiovascular risk and specific lipid/LDL goals reviewed.  We discussed several lifestyle modifications today and Stacey Marshall will continue to work on diet, exercise and weight loss efforts. Orders and follow up as documented in patient record. She will avoid trans fats, keep saturated fats to a minimum, and increase MUFA's and PUFA's.  Counseling Intensive lifestyle modifications are the first line treatment for this issue. . Dietary changes: Increase soluble fiber. Decrease simple carbohydrates. . Exercise changes: Moderate to vigorous-intensity aerobic activity 150 minutes per week if tolerated. . Lipid-lowering medications: see documented in medical record.  Class 2 severe obesity with serious comorbidity and body mass index (BMI) of 39.0 to 39.9 in adult, unspecified obesity type (HCC).  Stacey Marshall is currently in the action stage of change. As such, her goal is to continue with weight loss efforts. She has agreed to the Category 2 Plan.   She will work on meal planning, mindful eating, increasing her protein intake, and not skipping meals.  Handout was provided on Fruit Choices.  Exercise goals: All adults should avoid inactivity. Some physical activity is better than none, and adults who participate in any amount of physical activity gain some health benefits.  Behavioral modification  strategies: increasing lean protein intake, decreasing simple carbohydrates, increasing vegetables, increasing water intake, decreasing eating out, no skipping meals, meal planning and cooking strategies, keeping healthy foods in the home and avoiding temptations.  Stacey Marshall has agreed to follow-up with our clinic in 4 weeks. She was informed of the importance of frequent follow-up visits to maximize her success with intensive lifestyle modifications for her multiple health conditions.   Objective:   Blood pressure 119/84,  pulse 84, temperature 98 F (36.7 C), height 5\' 1"  (1.549 m), weight 209 lb (94.8 kg), SpO2 100 %. Body mass index is 39.49 kg/m.  General: Cooperative, alert, well developed, in no acute distress. HEENT: Conjunctivae and lids unremarkable. Cardiovascular: Regular rhythm.  Lungs: Normal work of breathing. Neurologic: No focal deficits.   Lab Results  Component Value Date   CREATININE 0.97 07/28/2019   BUN 11 07/28/2019   NA 140 07/28/2019   K 4.1 07/28/2019   CL 103 07/28/2019   CO2 20 07/28/2019   Lab Results  Component Value Date   ALT 25 07/28/2019   AST 31 07/28/2019   ALKPHOS 101 07/28/2019   BILITOT 0.4 07/28/2019   Lab Results  Component Value Date   HGBA1C 5.7 (H) 11/26/2019   Lab Results  Component Value Date   INSULIN 22.9 11/26/2019   Lab Results  Component Value Date   TSH 0.716 07/28/2019   Lab Results  Component Value Date   CHOL 197 07/28/2019   HDL 65 07/28/2019   LDLCALC 118 (H) 07/28/2019   TRIG 75 07/28/2019   CHOLHDL 3.2 06/11/2017   Lab Results  Component Value Date   WBC 8.3 07/28/2019   HGB 13.7 07/28/2019   HCT 40.5 07/28/2019   MCV 92 07/28/2019   PLT 183 07/28/2019   No results found for: IRON, TIBC, FERRITIN  Attestation Statements:   Reviewed by clinician on day of visit: allergies, medications, problem list, medical history, surgical history, family history, social history, and previous encounter notes.  Time spent on visit including pre-visit chart review and post-visit charting and care was 20 minutes.   07/30/2019, am acting as Fernanda Drum for Energy manager, DO   I have reviewed the above documentation for accuracy and completeness, and I agree with the above. [employer], DO

## 2020-01-21 ENCOUNTER — Ambulatory Visit (INDEPENDENT_AMBULATORY_CARE_PROVIDER_SITE_OTHER): Payer: Managed Care, Other (non HMO) | Admitting: Bariatrics

## 2020-02-06 ENCOUNTER — Other Ambulatory Visit (INDEPENDENT_AMBULATORY_CARE_PROVIDER_SITE_OTHER): Payer: Self-pay | Admitting: Bariatrics

## 2020-02-06 DIAGNOSIS — E559 Vitamin D deficiency, unspecified: Secondary | ICD-10-CM

## 2020-02-09 NOTE — Telephone Encounter (Signed)
Last OV with Dr Brown 

## 2020-02-16 ENCOUNTER — Ambulatory Visit (INDEPENDENT_AMBULATORY_CARE_PROVIDER_SITE_OTHER): Payer: Managed Care, Other (non HMO) | Admitting: Bariatrics

## 2020-02-17 ENCOUNTER — Ambulatory Visit (INDEPENDENT_AMBULATORY_CARE_PROVIDER_SITE_OTHER): Payer: Managed Care, Other (non HMO) | Admitting: Bariatrics

## 2020-02-17 ENCOUNTER — Encounter (INDEPENDENT_AMBULATORY_CARE_PROVIDER_SITE_OTHER): Payer: Self-pay | Admitting: Bariatrics

## 2020-02-17 ENCOUNTER — Other Ambulatory Visit: Payer: Self-pay

## 2020-02-17 VITALS — BP 120/78 | HR 98 | Temp 98.1°F | Ht 61.0 in | Wt 212.0 lb

## 2020-02-17 DIAGNOSIS — Z9189 Other specified personal risk factors, not elsewhere classified: Secondary | ICD-10-CM | POA: Diagnosis not present

## 2020-02-17 DIAGNOSIS — Z6841 Body Mass Index (BMI) 40.0 and over, adult: Secondary | ICD-10-CM | POA: Diagnosis not present

## 2020-02-17 DIAGNOSIS — E7849 Other hyperlipidemia: Secondary | ICD-10-CM

## 2020-02-17 DIAGNOSIS — E559 Vitamin D deficiency, unspecified: Secondary | ICD-10-CM

## 2020-02-17 MED ORDER — VITAMIN D (ERGOCALCIFEROL) 1.25 MG (50000 UNIT) PO CAPS: 50000.0000 [IU] | ORAL_CAPSULE | ORAL | 0 refills | Status: DC

## 2020-02-19 NOTE — Progress Notes (Signed)
Chief Complaint:   OBESITY Stacey Marshall is here to discuss her progress with her obesity treatment plan along with follow-up of her obesity related diagnoses. Stacey Marshall is on the Category 2 Plan and states she is following her eating plan approximately 20% of the time. Stacey Marshall states she is walking for 15 minutes 2 times per week.  Today's visit was #: 4 Starting weight: 214 lbs Starting date: 11/26/2019 Today's weight: 212 lbs Today's date: 02/17/2020 Total lbs lost to date: 2 lbs Total lbs lost since last in-office visit: 0  Interim History: She is up 3 pounds since her last visit.  She skips meals.  Subjective:   1. Vitamin D deficiency Stacey Marshall's Vitamin D level was 10.6 on 11/26/2019. She is currently taking prescription vitamin D 50,000 IU each week. She denies nausea, vomiting or muscle weakness.  2. Other hyperlipidemia Stacey Marshall has hyperlipidemia and has been trying to improve her cholesterol levels with intensive lifestyle modification including a low saturated fat diet, exercise and weight loss. She denies any chest pain, claudication or myalgias.  She is on no medications.  Lab Results  Component Value Date   ALT 25 07/28/2019   AST 31 07/28/2019   ALKPHOS 101 07/28/2019   BILITOT 0.4 07/28/2019   Lab Results  Component Value Date   CHOL 197 07/28/2019   HDL 65 07/28/2019   LDLCALC 118 (H) 07/28/2019   TRIG 75 07/28/2019   CHOLHDL 3.2 06/11/2017   3. At risk for heart disease Stacey Marshall is at a higher than average risk for cardiovascular disease due to obesity.   Assessment/Plan:   1. Vitamin D deficiency Low Vitamin D level contributes to fatigue and are associated with obesity, breast, and colon cancer. She agrees to continue to take prescription Vitamin D @50 ,000 IU every week and will follow-up for routine testing of Vitamin D, at least 2-3 times per year to avoid over-replacement.  -Refill Vitamin D, Ergocalciferol, (DRISDOL) 1.25 MG (50000 UNIT) CAPS capsule; Take 1  capsule (50,000 Units total) by mouth every 7 (seven) days.  Dispense: 12 capsule; Refill: 0  2. Other hyperlipidemia Cardiovascular risk and specific lipid/LDL goals reviewed.  We discussed several lifestyle modifications today and Stacey Marshall will continue to work on diet, exercise and weight loss efforts. Orders and follow up as documented in patient record.   Counseling Intensive lifestyle modifications are the first line treatment for this issue. . Dietary changes: Increase soluble fiber. Decrease simple carbohydrates. . Exercise changes: Moderate to vigorous-intensity aerobic activity 150 minutes per week if tolerated. . Lipid-lowering medications: see documented in medical record.  3. At risk for heart disease Stacey Marshall was given approximately 15 minutes of coronary artery disease prevention counseling today. She is 60 y.o. female and has risk factors for heart disease including obesity and hyperlipidemia. We discussed intensive lifestyle modifications today with an emphasis on specific weight loss instructions and strategies.   Repetitive spaced learning was employed today to elicit superior memory formation and behavioral change.  4. Class 3 severe obesity due to excess calories without serious comorbidity with body mass index (BMI) of 40.0 to 44.9 in adult Stacey Marshall)  Stacey Marshall is currently in the action stage of change. As such, her goal is to continue with weight loss efforts. She has agreed to the Category 2 Plan.   She will work on meal planning, mindful eating, will not skip meals.  She was given seasoning sheets and will increase her water intake.  Exercise goals: All adults should avoid inactivity.  Some physical activity is better than none, and adults who participate in any amount of physical activity gain some health benefits.  Behavioral modification strategies: increasing lean protein intake, decreasing simple carbohydrates, increasing vegetables, increasing water intake, decreasing eating  out, no skipping meals, meal planning and cooking strategies, keeping healthy foods in the home and planning for success.  Stacey Marshall has agreed to follow-up with our clinic in 2 weeks. She was informed of the importance of frequent follow-up visits to maximize her success with intensive lifestyle modifications for her multiple health conditions.   Objective:   Blood pressure 120/78, pulse 98, temperature 98.1 F (36.7 C), height 5\' 1"  (1.549 m), weight 212 lb (96.2 kg), SpO2 100 %. Body mass index is 40.06 kg/m.  General: Cooperative, alert, well developed, in no acute distress. HEENT: Conjunctivae and lids unremarkable. Cardiovascular: Regular rhythm.  Lungs: Normal work of breathing. Neurologic: No focal deficits.   Lab Results  Component Value Date   CREATININE 0.97 07/28/2019   BUN 11 07/28/2019   NA 140 07/28/2019   K 4.1 07/28/2019   CL 103 07/28/2019   CO2 20 07/28/2019   Lab Results  Component Value Date   ALT 25 07/28/2019   AST 31 07/28/2019   ALKPHOS 101 07/28/2019   BILITOT 0.4 07/28/2019   Lab Results  Component Value Date   HGBA1C 5.7 (H) 11/26/2019   Lab Results  Component Value Date   INSULIN 22.9 11/26/2019   Lab Results  Component Value Date   TSH 0.716 07/28/2019   Lab Results  Component Value Date   CHOL 197 07/28/2019   HDL 65 07/28/2019   LDLCALC 118 (H) 07/28/2019   TRIG 75 07/28/2019   CHOLHDL 3.2 06/11/2017   Lab Results  Component Value Date   WBC 8.3 07/28/2019   HGB 13.7 07/28/2019   HCT 40.5 07/28/2019   MCV 92 07/28/2019   PLT 183 07/28/2019   Attestation Statements:   Reviewed by clinician on day of visit: allergies, medications, problem list, medical history, surgical history, family history, social history, and previous encounter notes.  I, 07/30/2019, CMA, am acting as Insurance claims handler for Energy manager, DO  I have reviewed the above documentation for accuracy and completeness, and I agree with the above. [employer],  DO

## 2020-02-24 ENCOUNTER — Encounter (INDEPENDENT_AMBULATORY_CARE_PROVIDER_SITE_OTHER): Payer: Self-pay | Admitting: Bariatrics

## 2020-03-05 ENCOUNTER — Encounter (INDEPENDENT_AMBULATORY_CARE_PROVIDER_SITE_OTHER): Payer: Self-pay | Admitting: Bariatrics

## 2020-03-08 ENCOUNTER — Ambulatory Visit (INDEPENDENT_AMBULATORY_CARE_PROVIDER_SITE_OTHER): Payer: Managed Care, Other (non HMO) | Admitting: Bariatrics

## 2020-05-23 ENCOUNTER — Other Ambulatory Visit (INDEPENDENT_AMBULATORY_CARE_PROVIDER_SITE_OTHER): Payer: Self-pay | Admitting: Bariatrics

## 2020-05-23 DIAGNOSIS — E559 Vitamin D deficiency, unspecified: Secondary | ICD-10-CM

## 2020-08-02 ENCOUNTER — Ambulatory Visit (INDEPENDENT_AMBULATORY_CARE_PROVIDER_SITE_OTHER): Payer: Managed Care, Other (non HMO) | Admitting: Nurse Practitioner

## 2020-08-02 ENCOUNTER — Other Ambulatory Visit: Payer: Self-pay

## 2020-08-02 ENCOUNTER — Encounter: Payer: Self-pay | Admitting: Nurse Practitioner

## 2020-08-02 VITALS — BP 132/83 | HR 74 | Temp 98.4°F | Ht 61.2 in | Wt 210.8 lb

## 2020-08-02 DIAGNOSIS — Z1231 Encounter for screening mammogram for malignant neoplasm of breast: Secondary | ICD-10-CM

## 2020-08-02 DIAGNOSIS — Z23 Encounter for immunization: Secondary | ICD-10-CM

## 2020-08-02 DIAGNOSIS — Z0000 Encounter for general adult medical examination without abnormal findings: Secondary | ICD-10-CM

## 2020-08-02 DIAGNOSIS — L930 Discoid lupus erythematosus: Secondary | ICD-10-CM

## 2020-08-02 DIAGNOSIS — Z6839 Body mass index (BMI) 39.0-39.9, adult: Secondary | ICD-10-CM

## 2020-08-02 DIAGNOSIS — E6609 Other obesity due to excess calories: Secondary | ICD-10-CM

## 2020-08-02 DIAGNOSIS — R7303 Prediabetes: Secondary | ICD-10-CM | POA: Diagnosis not present

## 2020-08-02 DIAGNOSIS — E7800 Pure hypercholesterolemia, unspecified: Secondary | ICD-10-CM

## 2020-08-02 DIAGNOSIS — E559 Vitamin D deficiency, unspecified: Secondary | ICD-10-CM | POA: Diagnosis not present

## 2020-08-02 NOTE — Assessment & Plan Note (Signed)
She has lost 8 pounds in the last 2 weeks. She has been focusing on increasing her protein and eating 3 meals a day. She knows that losing weight will help with her knee pain. Goal is to lose 1-2 pounds per week.

## 2020-08-02 NOTE — Assessment & Plan Note (Signed)
Chronic, stable. Follows with rheumatology. Keep continued collaboration. Follow-up in 1 year.

## 2020-08-02 NOTE — Patient Instructions (Signed)
[facility]  Address: [address], [place], [place] [address]  Phone: [phone]  

## 2020-08-02 NOTE — Assessment & Plan Note (Signed)
Will recheck lipid panel today and treat based on results.

## 2020-08-02 NOTE — Assessment & Plan Note (Signed)
Chronic, taking vitamin D supplement. Check vitamin D level today.

## 2020-08-02 NOTE — Progress Notes (Signed)
BP 132/83   Pulse 74   Temp 98.4 F (36.9 C) (Oral)   Ht 5' 1.2" (1.554 m)   Wt 210 lb 12.8 oz (95.6 kg)   SpO2 98%   BMI 39.57 kg/m    Subjective:    Patient ID: Stacey Marshall, female    DOB: 04/16/1960, 60 y.o.   MRN: [id number]  Chief Complaint  Patient presents with   Annual Exam    HPI: Stacey Marshall is a 60 y.o. female presenting on 08/02/2020 for comprehensive medical examination. Current medical complaints include:none  She currently lives with: alone Menopausal Symptoms: no  Depression Screen done today and results listed below:  Depression screen [facility] 2/9 08/02/2020 11/26/2019 07/28/2019 07/19/2018 06/11/2017  Decreased Interest 0 1 0 0 0  Down, Depressed, Hopeless 0 1 0 0 0  PHQ - 2 Score 0 2 0 0 0  Altered sleeping - 2 - 0 -  Tired, decreased energy - 2 - 0 -  Change in appetite - 0 - 0 -  Feeling bad or failure about yourself  - 0 - 0 -  Trouble concentrating - 0 - 0 -  Moving slowly or fidgety/restless - 0 - 0 -  Suicidal thoughts - 0 - 0 -  PHQ-9 Score - 6 - 0 -  Difficult doing work/chores - Not difficult at all - Not difficult at all -    The patient does not have a history of falls. I did not complete a risk assessment for falls. A plan of care for falls was not documented.   Past Medical History:  Past Medical History:  Diagnosis Date   Back pain    Discoid lupus    Joint pain    Obesity    Rheumatoid arthritis ([place])     Surgical History:  Past Surgical History:  Procedure Laterality Date   COLONOSCOPY WITH PROPOFOL N/A 08/19/2018   Procedure: COLONOSCOPY WITH PROPOFOL;  Surgeon: Lin Landsman, MD;  Location: [facility] ENDOSCOPY;  Service: Gastroenterology;  Laterality: N/A;   MOUTH SURGERY      Medications:  Current Outpatient Medications on File Prior to Visit  Medication Sig   hydroxychloroquine (PLAQUENIL) 200 MG tablet Take 200 mg by mouth 2 (two) times daily.    No current facility-administered medications on file prior  to visit.    Allergies:  Allergies  Allergen Reactions   Bactrim [Sulfamethoxazole-Trimethoprim] Hives   Saxenda [Liraglutide -Weight Management] Rash    Social History:  Social History   Socioeconomic History   Marital status: Single    Spouse name: Not on file   Number of children: Not on file   Years of education: Not on file   Highest education level: Not on file  Occupational History   Occupation: [employer]  Tobacco Use   Smoking status: Never   Smokeless tobacco: Never  Vaping Use   Vaping Use: Never used  Substance and Sexual Activity   Alcohol use: Yes    Alcohol/week: 2.0 - 3.0 standard drinks    Types: 2 - 3 Glasses of wine per week   Drug use: No   Sexual activity: Not Currently    Birth control/protection: None  Other Topics Concern   Not on file  Social History Narrative   Not on file   Social Determinants of Health   Financial Resource Strain: Not on file  Food Insecurity: Not on file  Transportation Needs: Not on file  Physical Activity: Not on file  Stress: Not on file  Social Connections: Not on file  Intimate Partner Violence: Not on file   Social History   Tobacco Use  Smoking Status Never  Smokeless Tobacco Never   Social History   Substance and Sexual Activity  Alcohol Use Yes   Alcohol/week: 2.0 - 3.0 standard drinks   Types: 2 - 3 Glasses of wine per week    Family History:  Family History  Problem Relation Age of Onset   Diabetes Mother    Hypertension Mother    AAA (abdominal aortic aneurysm) Mother    Diabetes Father    Hypertension Father    Hypertension Sister    Diabetes Sister    Hypertension Sister    Hypertension Brother    Hyperlipidemia Brother    Diabetes Brother     Past medical history, surgical history, medications, allergies, family history and social history reviewed with patient today and changes made to appropriate areas of the chart.   Review of Systems  Constitutional: Negative.    HENT: Negative.    Eyes: Negative.   Respiratory: Negative.    Cardiovascular: Negative.   Gastrointestinal: Negative.   Genitourinary: Negative.   Musculoskeletal:  Positive for joint pain (left knee pain).  Skin: Negative.   Neurological: Negative.   Psychiatric/Behavioral: Negative.    All other ROS negative except what is listed above and in the HPI.      Objective:    BP 132/83   Pulse 74   Temp 98.4 F (36.9 C) (Oral)   Ht 5' 1.2" (1.554 m)   Wt 210 lb 12.8 oz (95.6 kg)   SpO2 98%   BMI 39.57 kg/m   Wt Readings from Last 3 Encounters:  08/02/20 210 lb 12.8 oz (95.6 kg)  02/17/20 212 lb (96.2 kg)  12/24/19 209 lb (94.8 kg)    Physical Exam Vitals and nursing note reviewed.  Constitutional:      General: She is not in acute distress.    Appearance: Normal appearance.  HENT:     Head: Normocephalic and atraumatic.     Right Ear: External ear normal.     Left Ear: External ear normal.     Nose: Nose normal.     Mouth/Throat:     Mouth: Mucous membranes are moist.     Pharynx: Oropharynx is clear.  Eyes:     Conjunctiva/sclera: Conjunctivae normal.  Cardiovascular:     Rate and Rhythm: Normal rate and regular rhythm.     Pulses: Normal pulses.     Heart sounds: Normal heart sounds.  Pulmonary:     Effort: Pulmonary effort is normal.     Breath sounds: Normal breath sounds.  Abdominal:     General: Bowel sounds are normal.     Palpations: Abdomen is soft.     Tenderness: There is no abdominal tenderness.  Musculoskeletal:        General: Normal range of motion.     Cervical back: Normal range of motion.     Comments: Strength 5/5 in bilateral upper and lower extremities   Skin:    General: Skin is warm and dry.  Neurological:     General: No focal deficit present.     Mental Status: She is alert and oriented to person, place, and time.     Cranial Nerves: No cranial nerve deficit.     Coordination: Coordination normal.     Gait: Gait normal.   Psychiatric:  Mood and Affect: Mood normal.        Behavior: Behavior normal.        Thought Content: Thought content normal.        Judgment: Judgment normal.    Results for orders placed or performed in visit on 11/26/19  VITAMIN D 25 Hydroxy (Vit-D Deficiency, Fractures)  Result Value Ref Range   Vit D, 25-Hydroxy 10.6 (L) 30.0 - 100.0 ng/mL  Insulin, random  Result Value Ref Range   INSULIN 22.9 2.6 - 24.9 uIU/mL  Hemoglobin A1c  Result Value Ref Range   Hgb A1c MFr Bld 5.7 (H) 4.8 - 5.6 %   Est. average glucose Bld gHb Est-mCnc 117 mg/dL      Assessment & Plan:   Problem List Items Addressed This Visit       Musculoskeletal and Integument   Discoid lupus    Chronic, stable. Follows with rheumatology. Keep continued collaboration. Follow-up in 1 year.         Other   Obesity    She has lost 8 pounds in the last 2 weeks. She has been focusing on increasing her protein and eating 3 meals a day. She knows that losing weight will help with her knee pain. Goal is to lose 1-2 pounds per week.        Hyperlipemia    Will recheck lipid panel today and treat based on results.        Relevant Orders   Lipid Panel w/o Chol/HDL Ratio   Prediabetes    Has been watching her diet recently and trying to lose weight. Will check A1C and treat based on results.        Relevant Orders   Bayer DCA Hb A1c Waived   Vitamin D deficiency    Chronic, taking vitamin D supplement. Check vitamin D level today.        Relevant Orders   Vitamin D (25 hydroxy)   Other Visit Diagnoses     Encounter for screening mammogram for malignant neoplasm of breast    -  Primary   mammogram ordered today   Relevant Orders   MM 3D SCREEN BREAST BILATERAL   Routine general medical examination at a health care facility       Health maintenance reviewed. Second shingrix given today. Check CMP, CBC, TSH today.    Relevant Orders   Comp Met (CMET)   CBC with Differential   TSH    Need for shingles vaccine       second shingrix dose given today   Relevant Orders   Varicella-zoster vaccine IM (Shingrix) (Completed)        Follow up plan: Return in about 1 year (around 08/02/2021) for physical.   LABORATORY TESTING:  - Pap smear: done elsewhere  IMMUNIZATIONS:   - Tdap: Tetanus vaccination status reviewed: last tetanus booster within 10 years. - Influenza: Up to date - Pneumovax: Not applicable - Prevnar: Not applicable - HPV: Not applicable - Zostavax vaccine: Second dose given today  SCREENING: -Mammogram: Ordered today  - Colonoscopy: Up to date  - Bone Density: Not applicable  -Hearing Test: Not applicable  -Spirometry: Not applicable   PATIENT COUNSELING:   Advised to take 1 mg of folate supplement per day if capable of pregnancy.   Sexuality: Discussed sexually transmitted diseases, partner selection, use of condoms, avoidance of unintended pregnancy  and contraceptive alternatives.   Advised to avoid cigarette smoking.  I discussed with the patient that most people  either abstain from alcohol or drink within safe limits (<=14/week and <=4 drinks/occasion for males, <=7/weeks and <= 3 drinks/occasion for females) and that the risk for alcohol disorders and other health effects rises proportionally with the number of drinks per week and how often a drinker exceeds daily limits.  Discussed cessation/primary prevention of drug use and availability of treatment for abuse.   Diet: Encouraged to adjust caloric intake to maintain  or achieve ideal body weight, to reduce intake of dietary saturated fat and total fat, to limit sodium intake by avoiding high sodium foods and not adding table salt, and to maintain adequate dietary potassium and calcium preferably from fresh fruits, vegetables, and low-fat dairy products.    stressed the importance of regular exercise  Injury prevention: Discussed safety belts, safety helmets, smoke detector, smoking near  bedding or upholstery.   Dental health: Discussed importance of regular tooth brushing, flossing, and dental visits.    NEXT PREVENTATIVE PHYSICAL DUE IN 1 YEAR. Return in about 1 year (around 08/02/2021) for physical.

## 2020-08-02 NOTE — Assessment & Plan Note (Signed)
Has been watching her diet recently and trying to lose weight. Will check A1C and treat based on results.

## 2020-08-03 ENCOUNTER — Telehealth: Payer: Self-pay | Admitting: Nurse Practitioner

## 2020-08-03 ENCOUNTER — Other Ambulatory Visit: Payer: Self-pay | Admitting: Nurse Practitioner

## 2020-08-03 MED ORDER — FLUTICASONE PROPIONATE 50 MCG/ACT NA SUSP: 2.0000 | Freq: Every day | NASAL | 6 refills | Status: DC

## 2020-08-03 NOTE — Telephone Encounter (Signed)
[facility] sent to [facility].

## 2020-08-03 NOTE — Telephone Encounter (Signed)
Medication requested is not on list  Review for script

## 2020-08-03 NOTE — Telephone Encounter (Signed)
Medication Refill - Medication: fluticasone (FLONASE) 50 MCG/ACT nasal spray   estradiol (ESTRACE) 0.1 MG/GM vaginal cream Has the patient contacted their pharmacy? No. (Agent: If no, request that the patient contact the pharmacy for the refill.) (Agent: If yes, when and what did the pharmacy advise?)  Preferred Pharmacy (with phone number or street name):  [facility]  [facility]) - [place], [place] - [address] Phone:  [phone]  Fax:  [phone]      Agent: Please be advised that RX refills may take up to 3 business days. We ask that you follow-up with your pharmacy.

## 2020-08-03 NOTE — Telephone Encounter (Signed)
Pt was seen yesterday please advise

## 2020-08-04 LAB — CBC WITH DIFFERENTIAL/PLATELET
Basophils Absolute: 0.1 10*3/uL (ref 0.0–0.2)
Basos: 1 %
EOS (ABSOLUTE): 0.4 10*3/uL (ref 0.0–0.4)
Eos: 7 %
Hematocrit: 40.6 % (ref 34.0–46.6)
Hemoglobin: 13.8 g/dL (ref 11.1–15.9)
Immature Grans (Abs): 0 10*3/uL (ref 0.0–0.1)
Immature Granulocytes: 0 %
Lymphocytes Absolute: 3.2 10*3/uL — ABNORMAL HIGH (ref 0.7–3.1)
Lymphs: 51 %
MCH: 30.8 pg (ref 26.6–33.0)
MCHC: 34 g/dL (ref 31.5–35.7)
MCV: 91 fL (ref 79–97)
Monocytes Absolute: 0.6 10*3/uL (ref 0.1–0.9)
Monocytes: 9 %
Neutrophils Absolute: 2.1 10*3/uL (ref 1.4–7.0)
Neutrophils: 32 %
Platelets: 202 10*3/uL (ref 150–450)
RBC: 4.48 x10E6/uL (ref 3.77–5.28)
RDW: 13.4 % (ref 11.7–15.4)
WBC: 6.4 10*3/uL (ref 3.4–10.8)

## 2020-08-04 LAB — LIPID PANEL W/O CHOL/HDL RATIO
Cholesterol, Total: 190 mg/dL (ref 100–199)
HDL: 53 mg/dL (ref >39)
LDL Chol Calc (NIH): 120 mg/dL — ABNORMAL HIGH (ref 0–99)
Triglycerides: 93 mg/dL (ref 0–149)
VLDL Cholesterol Cal: 17 mg/dL (ref 5–40)

## 2020-08-04 LAB — COMPREHENSIVE METABOLIC PANEL
ALT: 25 IU/L (ref 0–32)
AST: 26 IU/L (ref 0–40)
Albumin/Globulin Ratio: 1.4 (ref 1.2–2.2)
Albumin: 4.2 g/dL (ref 3.8–4.9)
Alkaline Phosphatase: 75 IU/L (ref 44–121)
BUN/Creatinine Ratio: 14 (ref 9–23)
BUN: 13 mg/dL (ref 6–24)
Bilirubin Total: 0.2 mg/dL (ref 0.0–1.2)
CO2: 22 mmol/L (ref 20–29)
Calcium: 9.6 mg/dL (ref 8.7–10.2)
Chloride: 102 mmol/L (ref 96–106)
Creatinine, Ser: 0.96 mg/dL (ref 0.57–1.00)
Globulin, Total: 3.1 g/dL (ref 1.5–4.5)
Glucose: 79 mg/dL (ref 65–99)
Potassium: 4.9 mmol/L (ref 3.5–5.2)
Sodium: 138 mmol/L (ref 134–144)
Total Protein: 7.3 g/dL (ref 6.0–8.5)
eGFR: 69 mL/min/{1.73_m2} (ref >59)

## 2020-08-04 LAB — VITAMIN D 25 HYDROXY (VIT D DEFICIENCY, FRACTURES): Vit D, 25-Hydroxy: 36.7 ng/mL (ref 30.0–100.0)

## 2020-08-04 LAB — TSH: TSH: 1.15 u[IU]/mL (ref 0.450–4.500)

## 2020-08-06 ENCOUNTER — Other Ambulatory Visit: Payer: Managed Care, Other (non HMO)

## 2020-08-06 ENCOUNTER — Other Ambulatory Visit: Payer: Self-pay

## 2020-08-06 ENCOUNTER — Telehealth: Payer: Self-pay

## 2020-08-06 DIAGNOSIS — R748 Abnormal levels of other serum enzymes: Secondary | ICD-10-CM

## 2020-08-06 DIAGNOSIS — E7800 Pure hypercholesterolemia, unspecified: Secondary | ICD-10-CM

## 2020-08-06 DIAGNOSIS — Z0000 Encounter for general adult medical examination without abnormal findings: Secondary | ICD-10-CM

## 2020-08-06 DIAGNOSIS — R7303 Prediabetes: Secondary | ICD-10-CM

## 2020-08-06 DIAGNOSIS — E559 Vitamin D deficiency, unspecified: Secondary | ICD-10-CM

## 2020-08-06 LAB — BAYER DCA HB A1C WAIVED: HB A1C (BAYER DCA - WAIVED): 5.4 % (ref <7.0)

## 2020-08-06 NOTE — Telephone Encounter (Signed)
Called pt and she said about a month ago she has a lot of inflammation and rheumontologist checked her ck level was above 500 she said her inflammation has went down but she wants to check it. Scheduled lab appt today

## 2020-08-06 NOTE — Telephone Encounter (Signed)
She has had an elevated CK level a few months ago at rheumatology office. Will place order to have CK level rechecked today.

## 2020-08-06 NOTE — Telephone Encounter (Signed)
Copied from CRM [id number]. Topic: Appointment Scheduling - Scheduling Inquiry for Clinic >> Aug 06, 2020 10:18 AM Stacey Marshall wrote: Reason for CRM: Pt called in stating she had an appt on 06/27, and wanted to get her CK levels checked, but it did not happen on her appt. Pt wanted to see about getting that lab ordered and a nurse to give her call about coming in just get that done. Please advise

## 2020-08-07 LAB — COMPREHENSIVE METABOLIC PANEL
ALT: 22 IU/L (ref 0–32)
AST: 24 IU/L (ref 0–40)
Albumin/Globulin Ratio: 1.2 (ref 1.2–2.2)
Albumin: 4.1 g/dL (ref 3.8–4.9)
Alkaline Phosphatase: 76 IU/L (ref 44–121)
BUN/Creatinine Ratio: 15 (ref 9–23)
BUN: 14 mg/dL (ref 6–24)
Bilirubin Total: 0.2 mg/dL (ref 0.0–1.2)
CO2: 24 mmol/L (ref 20–29)
Calcium: 9.9 mg/dL (ref 8.7–10.2)
Chloride: 103 mmol/L (ref 96–106)
Creatinine, Ser: 0.93 mg/dL (ref 0.57–1.00)
Globulin, Total: 3.4 g/dL (ref 1.5–4.5)
Glucose: 79 mg/dL (ref 65–99)
Potassium: 4.7 mmol/L (ref 3.5–5.2)
Sodium: 141 mmol/L (ref 134–144)
Total Protein: 7.5 g/dL (ref 6.0–8.5)
eGFR: 71 mL/min/{1.73_m2} (ref >59)

## 2020-08-07 LAB — LIPID PANEL W/O CHOL/HDL RATIO
Cholesterol, Total: 200 mg/dL — ABNORMAL HIGH (ref 100–199)
HDL: 51 mg/dL (ref >39)
LDL Chol Calc (NIH): 124 mg/dL — ABNORMAL HIGH (ref 0–99)
Triglycerides: 140 mg/dL (ref 0–149)
VLDL Cholesterol Cal: 25 mg/dL (ref 5–40)

## 2020-08-07 LAB — CK: Total CK: 237 U/L — ABNORMAL HIGH (ref 32–182)

## 2020-08-07 LAB — CBC WITH DIFFERENTIAL/PLATELET
Basophils Absolute: 0.1 10*3/uL (ref 0.0–0.2)
Basos: 1 %
EOS (ABSOLUTE): 0.4 10*3/uL (ref 0.0–0.4)
Eos: 5 %
Hematocrit: 39.8 % (ref 34.0–46.6)
Hemoglobin: 13.5 g/dL (ref 11.1–15.9)
Immature Grans (Abs): 0 10*3/uL (ref 0.0–0.1)
Immature Granulocytes: 0 %
Lymphocytes Absolute: 3.5 10*3/uL — ABNORMAL HIGH (ref 0.7–3.1)
Lymphs: 44 %
MCH: 31 pg (ref 26.6–33.0)
MCHC: 33.9 g/dL (ref 31.5–35.7)
MCV: 91 fL (ref 79–97)
Monocytes Absolute: 0.5 10*3/uL (ref 0.1–0.9)
Monocytes: 6 %
Neutrophils Absolute: 3.6 10*3/uL (ref 1.4–7.0)
Neutrophils: 44 %
Platelets: 232 10*3/uL (ref 150–450)
RBC: 4.36 x10E6/uL (ref 3.77–5.28)
RDW: 13.7 % (ref 11.7–15.4)
WBC: 8 10*3/uL (ref 3.4–10.8)

## 2020-08-07 LAB — TSH: TSH: 1.29 u[IU]/mL (ref 0.450–4.500)

## 2020-08-07 LAB — VITAMIN D 25 HYDROXY (VIT D DEFICIENCY, FRACTURES): Vit D, 25-Hydroxy: 34.7 ng/mL (ref 30.0–100.0)

## 2020-09-06 ENCOUNTER — Encounter: Payer: Self-pay | Admitting: Obstetrics and Gynecology

## 2020-09-06 ENCOUNTER — Ambulatory Visit (INDEPENDENT_AMBULATORY_CARE_PROVIDER_SITE_OTHER): Payer: Managed Care, Other (non HMO) | Admitting: Obstetrics and Gynecology

## 2020-09-06 ENCOUNTER — Other Ambulatory Visit: Payer: Self-pay

## 2020-09-06 VITALS — BP 122/76 | HR 74 | Ht 61.5 in | Wt 210.0 lb

## 2020-09-06 DIAGNOSIS — Z1239 Encounter for other screening for malignant neoplasm of breast: Secondary | ICD-10-CM | POA: Diagnosis not present

## 2020-09-06 DIAGNOSIS — Z1231 Encounter for screening mammogram for malignant neoplasm of breast: Secondary | ICD-10-CM

## 2020-09-06 DIAGNOSIS — Z124 Encounter for screening for malignant neoplasm of cervix: Secondary | ICD-10-CM

## 2020-09-06 DIAGNOSIS — Z01419 Encounter for gynecological examination (general) (routine) without abnormal findings: Secondary | ICD-10-CM | POA: Diagnosis not present

## 2020-09-06 NOTE — Progress Notes (Signed)
Gynecology Annual Exam  PCP: Gerre Scull, NP  Chief Complaint:  Chief Complaint  Patient presents with   Gynecologic Exam    Annual - no concerns. RM 5    History of Present Illness:Patient is a 60 y.o. G0P0000 presents for annual exam. The patient has no complaints today.   LMP: No LMP recorded. Patient is perimenopausal. No PMB  The patient is sexually active. She denies dyspareunia.  The patient does perform self breast exams.  There is no notable family history of breast or ovarian cancer in her family.  The patient wears seatbelts: yes.   The patient has regular exercise: not asked.    The patient denies current symptoms of depression.     Review of Systems: Review of Systems  Constitutional:  Negative for chills and fever.  HENT:  Negative for congestion.   Respiratory:  Negative for cough and shortness of breath.   Cardiovascular:  Negative for chest pain and palpitations.  Gastrointestinal:  Negative for abdominal pain, constipation, diarrhea, heartburn, nausea and vomiting.  Genitourinary:  Negative for dysuria, frequency and urgency.  Skin:  Negative for itching and rash.  Neurological:  Negative for dizziness and headaches.  Endo/Heme/Allergies:  Negative for polydipsia.  Psychiatric/Behavioral:  Negative for depression.    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 Patient is  perimenopausal. Last Pap: Results were: 07/28/2019 NILM HPV positive 16/18 negative Last mammogram: 08/08/2019  Obstetric History: G0P0000  Family History:  Family History  Problem Relation Age of Onset   Diabetes Mother    Hypertension Mother    AAA (abdominal aortic aneurysm) Mother    Diabetes Father    Hypertension Father    Hypertension Sister    Diabetes Sister    Hypertension Sister    Hypertension Brother    Hyperlipidemia Brother    Diabetes Brother     Social History:  Social History   Socioeconomic History   Marital status: Single    Spouse name: Not on file   Number of children: Not on file   Years of education: Not on file   Highest education level: Not on file  Occupational History   Occupation: [employer]  Tobacco Use   Smoking status: Never   Smokeless tobacco: Never  Vaping Use   Vaping Use: Never used  Substance and Sexual Activity   Alcohol use: Yes    Alcohol/week: 2.0 - 3.0 standard drinks    Types: 2 - 3 Glasses of wine per week   Drug use: No   Sexual activity: Not Currently    Birth control/protection: None  Other Topics Concern   Not on file  Social History Narrative   Not on file   Social Determinants of Health   Financial Resource Strain: Not on file  Food Insecurity: Not on file  Transportation Needs: Not on file  Physical Activity: Not on file  Stress: Not  on file  Social Connections: Not on file  Intimate Partner Violence: Not on file    Allergies:  Allergies  Allergen Reactions   Bactrim [Sulfamethoxazole-Trimethoprim] Hives   Saxenda [Liraglutide -Weight Management] Rash    Medications: Prior to Admission medications   Medication Sig Start Date End Date Taking? Authorizing Provider  fluticasone (FLONASE) 50 MCG/ACT nasal spray Place 2 sprays into both nostrils daily. 08/03/20   McElwee, Lauren A, NP  hydroxychloroquine (PLAQUENIL) 200 MG tablet Take 200 mg by mouth 2 (two) times daily.  05/17/16   [provider]    Physical Exam Vitals: Blood pressure 122/76, pulse 74, height 5' 1.5" (1.562 m), weight 210 lb (95.3 kg).  General: NAD HEENT: normocephalic, anicteric Thyroid: no enlargement, no palpable nodules Pulmonary: No increased work of breathing, CTAB Cardiovascular: RRR, distal pulses 2+ Breast: Breast symmetrical, no tenderness, no palpable nodules or masses, no skin or nipple retraction present, no nipple discharge.  No axillary or supraclavicular lymphadenopathy. Abdomen: NABS, soft, non-tender, non-distended.  Umbilicus without lesions.  No hepatomegaly, splenomegaly or masses palpable. No evidence of hernia  Genitourinary:  External: Normal external female genitalia.  Normal urethral meatus, normal Bartholin's and Skene's glands.    Vagina: Normal vaginal mucosa, no evidence of prolapse.    Cervix: Grossly normal in appearance, no bleeding  Uterus: Non-enlarged, mobile, normal contour.  No CMT  Adnexa: ovaries non-enlarged, no adnexal masses  Rectal: deferred  Lymphatic: no evidence of inguinal lymphadenopathy Extremities: no edema, erythema, or tenderness Neurologic: Grossly intact Psychiatric: mood appropriate, affect full  Female chaperone present for pelvic and breast  portions of the physical exam     Assessment: 60 y.o. G0P0000 routine annual exam  Plan: Problem List Items Addressed This Visit   None Visit Diagnoses     Encounter for gynecological examination without abnormal finding    -  Primary   Screening for malignant neoplasm of cervix       Relevant Orders   IGP, Aptima HPV, rfx 16/18,45   Breast screening       Breast cancer screening by mammogram       Relevant Orders   MM 3D SCREEN BREAST BILATERAL       1) Mammogram - recommend yearly screening mammogram.  Mammogram Was ordered today  2) STI screening  was notoffered and therefore not obtained  3) ASCCP guidelines and rational discussed.  Patient opts for every 3 years screening  interval - repeat from NILM HPV positive pap this year  4) Osteoporosis  - per USPTF routine screening DEXA at age 20   5) Routine healthcare maintenance including cholesterol, diabetes screening discussed managed by PCP  6) Colonoscopy UTD  7) Return in about 1 year (around 09/06/2021) for annual.    Vena Austria, MD Domingo Pulse, [facility] 09/06/2020, 10:36 AM

## 2020-09-06 NOTE — Patient Instructions (Signed)
[facility] [address] [place] [address]  [facility] [place]  [address] [place] [address]  Phone: [phone]  

## 2020-09-10 LAB — IGP, APTIMA HPV, RFX 16/18,45
HPV Aptima: POSITIVE — AB
HPV Genotype 16: NEGATIVE
HPV Genotype 18,45: POSITIVE — AB

## 2020-09-28 NOTE — Progress Notes (Signed)
Colposcopy first available

## 2020-09-29 ENCOUNTER — Telehealth: Payer: Self-pay

## 2020-09-29 NOTE — Telephone Encounter (Signed)
Called and left voicemail for patient to call back to be scheduled. 

## 2020-09-29 NOTE — Telephone Encounter (Signed)
-----   Message from Vena Austria, MD sent at 09/28/2020  4:54 PM EDT ----- Colposcopy first available

## 2020-09-29 NOTE — Telephone Encounter (Signed)
Patient is scheduled for 09/26 at 10:10am

## 2020-10-21 ENCOUNTER — Telehealth: Payer: Self-pay | Admitting: Nurse Practitioner

## 2020-10-21 NOTE — Telephone Encounter (Signed)
Medication: estradiol (ESTRACE) 0.1 MG/GM vaginal cream [[id number]]  DISCONTINUED Last appt 08/03/20  Has the patient contacted their pharmacy? No- advised Pt next time to contact her pharmacy first (Agent: If no, request that the patient contact the pharmacy for the refill.) (Agent: If yes, when and what did the pharmacy advise?)  Preferred Pharmacy (with phone number or street name): [facility]  [facility]) [place], [place] - [address] [facility] [address] [place] [address] [place] [address] Phone: [phone] Fax: [phone] Hours: Not open 24 hours    Agent: Please be advised that RX refills may take up to 3 business days. We ask that you follow-up with your pharmacy.

## 2020-10-21 NOTE — Telephone Encounter (Signed)
Routing to provider to advise if medication can we sent in.

## 2020-10-22 NOTE — Telephone Encounter (Signed)
Pt has been scheduled on 10/25/2020 at 11 am.

## 2020-10-22 NOTE — Telephone Encounter (Signed)
She has not been given this medication in 2 years and will need an appointment for refills.

## 2020-10-22 NOTE — Telephone Encounter (Signed)
Will address at visit.

## 2020-10-23 NOTE — Progress Notes (Deleted)
Established Patient Office Visit  Subjective:  Patient ID: Stacey Marshall, female    DOB: [birth year]  Age: 60 y.o. MRN: [id number]  CC: No chief complaint on file.   HPI Wilson Sample Rebstock presents for refill on her estrace vaginal cream.   Past Medical History:  Diagnosis Date   Back pain    Discoid lupus    Joint pain    Obesity    Rheumatoid arthritis ([place])     Past Surgical History:  Procedure Laterality Date   COLONOSCOPY WITH PROPOFOL N/A 08/19/2018   Procedure: COLONOSCOPY WITH PROPOFOL;  Surgeon: Lin Landsman, MD;  Location: [facility] ENDOSCOPY;  Service: Gastroenterology;  Laterality: N/A;   MOUTH SURGERY      Family History  Problem Relation Age of Onset   Diabetes Mother    Hypertension Mother    AAA (abdominal aortic aneurysm) Mother    Diabetes Father    Hypertension Father    Hypertension Sister    Diabetes Sister    Hypertension Sister    Hypertension Brother    Hyperlipidemia Brother    Diabetes Brother     Social History   Socioeconomic History   Marital status: Single    Spouse name: Not on file   Number of children: Not on file   Years of education: Not on file   Highest education level: Not on file  Occupational History   Occupation: [employer]  Tobacco Use   Smoking status: Never   Smokeless tobacco: Never  Vaping Use   Vaping Use: Never used  Substance and Sexual Activity   Alcohol use: Yes    Alcohol/week: 2.0 - 3.0 standard drinks    Types: 2 - 3 Glasses of wine per week   Drug use: No   Sexual activity: Not Currently    Birth control/protection: None  Other Topics Concern   Not on file  Social History Narrative   Not on file   Social Determinants of Health   Financial Resource Strain: Not on file  Food Insecurity: Not on file  Transportation Needs: Not on file  Physical Activity: Not on file  Stress: Not on file  Social Connections: Not on file  Intimate Partner Violence: Not on file    Outpatient  Medications Prior to Visit  Medication Sig Dispense Refill   fluticasone (FLONASE) 50 MCG/ACT nasal spray Place 2 sprays into both nostrils daily. 16 g 6   hydroxychloroquine (PLAQUENIL) 200 MG tablet Take 200 mg by mouth 2 (two) times daily.      No facility-administered medications prior to visit.    Allergies  Allergen Reactions   Bactrim [Sulfamethoxazole-Trimethoprim] Hives   Saxenda [Liraglutide -Weight Management] Rash    ROS Review of Systems    Objective:    Physical Exam  There were no vitals taken for this visit. Wt Readings from Last 3 Encounters:  09/06/20 210 lb (95.3 kg)  08/02/20 210 lb 12.8 oz (95.6 kg)  02/17/20 212 lb (96.2 kg)     Health Maintenance Due  Topic Date Due   MAMMOGRAM  08/07/2020   INFLUENZA VACCINE  09/06/2020    There are no preventive care reminders to display for this patient.  Lab Results  Component Value Date   TSH 1.290 08/06/2020   Lab Results  Component Value Date   WBC 8.0 08/06/2020   HGB 13.5 08/06/2020   HCT 39.8 08/06/2020   MCV 91 08/06/2020   PLT 232 08/06/2020   Lab Results  Component Value Date   NA 141 08/06/2020   K 4.7 08/06/2020   CO2 24 08/06/2020   GLUCOSE 79 08/06/2020   BUN 14 08/06/2020   CREATININE 0.93 08/06/2020   BILITOT 0.2 08/06/2020   ALKPHOS 76 08/06/2020   AST 24 08/06/2020   ALT 22 08/06/2020   PROT 7.5 08/06/2020   ALBUMIN 4.1 08/06/2020   CALCIUM 9.9 08/06/2020   EGFR 71 08/06/2020   Lab Results  Component Value Date   CHOL 200 (H) 08/06/2020   Lab Results  Component Value Date   HDL 51 08/06/2020   Lab Results  Component Value Date   LDLCALC 124 (H) 08/06/2020   Lab Results  Component Value Date   TRIG 140 08/06/2020   Lab Results  Component Value Date   CHOLHDL 3.2 06/11/2017   Lab Results  Component Value Date   HGBA1C 5.4 08/06/2020      Assessment & Plan:   Problem List Items Addressed This Visit   None   No orders of the defined types were  placed in this encounter.   Follow-up: No follow-ups on file.    Charyl Dancer, NP

## 2020-10-25 ENCOUNTER — Ambulatory Visit: Payer: Managed Care, Other (non HMO) | Admitting: Nurse Practitioner

## 2020-10-25 NOTE — Progress Notes (Signed)
Established Patient Office Visit  Subjective:  Patient ID: Stacey Marshall, female    DOB: [birth year]  Age: 60 y.o. MRN: [id number]  CC:  Chief Complaint  Patient presents with   Menopause    Vaginal dryness   HPI Stacey Marshall presents for refill on her estrace vaginal cream. She has a history of vaginal dryness and this has been controlled with estradiol cream as needed twice a week. She denies history of cancer, blood clots, hot flashes, and mood swings. Last menstrual period 1.5 years ago.   Past Medical History:  Diagnosis Date   Back pain    Discoid lupus    Joint pain    Obesity    Rheumatoid arthritis ([facility])     Past Surgical History:  Procedure Laterality Date   COLONOSCOPY WITH PROPOFOL N/A 08/19/2018   Procedure: COLONOSCOPY WITH PROPOFOL;  Surgeon: Toney Reil, MD;  Location: [facility] ENDOSCOPY;  Service: Gastroenterology;  Laterality: N/A;   MOUTH SURGERY      Family History  Problem Relation Age of Onset   Diabetes Mother    Hypertension Mother    AAA (abdominal aortic aneurysm) Mother    Diabetes Father    Hypertension Father    Hypertension Sister    Diabetes Sister    Hypertension Sister    Hypertension Brother    Hyperlipidemia Brother    Diabetes Brother     Social History   Socioeconomic History   Marital status: Single    Spouse name: Not on file   Number of children: Not on file   Years of education: Not on file   Highest education level: Not on file  Occupational History   Occupation: [employer]  Tobacco Use   Smoking status: Never   Smokeless tobacco: Never  Vaping Use   Vaping Use: Never used  Substance and Sexual Activity   Alcohol use: Yes    Alcohol/week: 2.0 - 3.0 standard drinks    Types: 2 - 3 Glasses of wine per week   Drug use: No   Sexual activity: Not Currently    Birth control/protection: None  Other Topics Concern   Not on file  Social History Narrative   Not on file   Social Determinants  of Health   Financial Resource Strain: Not on file  Food Insecurity: Not on file  Transportation Needs: Not on file  Physical Activity: Not on file  Stress: Not on file  Social Connections: Not on file  Intimate Partner Violence: Not on file    Outpatient Medications Prior to Visit  Medication Sig Dispense Refill   fluticasone (FLONASE) 50 MCG/ACT nasal spray Place 2 sprays into both nostrils daily. 16 g 6   hydroxychloroquine (PLAQUENIL) 200 MG tablet Take 200 mg by mouth 2 (two) times daily.      No facility-administered medications prior to visit.    Allergies  Allergen Reactions   Bactrim [Sulfamethoxazole-Trimethoprim] Hives   Saxenda [Liraglutide -Weight Management] Rash    ROS Review of Systems  Constitutional: Negative.   Respiratory: Negative.    Cardiovascular: Negative.   Gastrointestinal: Negative.   Genitourinary:        Vaginal dryness  Skin: Negative.   Neurological: Negative.      Objective:    Physical Exam Vitals and nursing note reviewed.  Constitutional:      General: She is not in acute distress.    Appearance: Normal appearance.  HENT:     Head: Normocephalic and atraumatic.  Eyes:     Conjunctiva/sclera: Conjunctivae normal.  Cardiovascular:     Rate and Rhythm: Normal rate and regular rhythm.     Pulses: Normal pulses.     Heart sounds: Normal heart sounds.  Pulmonary:     Effort: Pulmonary effort is normal.     Breath sounds: Normal breath sounds.  Musculoskeletal:     Cervical back: Normal range of motion.  Skin:    General: Skin is warm and dry.  Neurological:     General: No focal deficit present.     Mental Status: She is alert and oriented to person, place, and time.  Psychiatric:        Mood and Affect: Mood normal.        Behavior: Behavior normal.        Thought Content: Thought content normal.        Judgment: Judgment normal.    BP (!) 165/94 (BP Location: Right Arm, Patient Position: Sitting)   Pulse 80   Temp  98.3 F (36.8 C)   Resp 18   Wt 215 lb 9.6 oz (97.8 kg)   BMI 40.08 kg/m  Wt Readings from Last 3 Encounters:  10/26/20 215 lb 9.6 oz (97.8 kg)  09/06/20 210 lb (95.3 kg)  08/02/20 210 lb 12.8 oz (95.6 kg)     Health Maintenance Due  Topic Date Due   MAMMOGRAM  08/07/2020   INFLUENZA VACCINE  09/06/2020    There are no preventive care reminders to display for this patient.  Lab Results  Component Value Date   TSH 1.290 08/06/2020   Lab Results  Component Value Date   WBC 8.0 08/06/2020   HGB 13.5 08/06/2020   HCT 39.8 08/06/2020   MCV 91 08/06/2020   PLT 232 08/06/2020   Lab Results  Component Value Date   NA 141 08/06/2020   K 4.7 08/06/2020   CO2 24 08/06/2020   GLUCOSE 79 08/06/2020   BUN 14 08/06/2020   CREATININE 0.93 08/06/2020   BILITOT 0.2 08/06/2020   ALKPHOS 76 08/06/2020   AST 24 08/06/2020   ALT 22 08/06/2020   PROT 7.5 08/06/2020   ALBUMIN 4.1 08/06/2020   CALCIUM 9.9 08/06/2020   EGFR 71 08/06/2020   Lab Results  Component Value Date   CHOL 200 (H) 08/06/2020   Lab Results  Component Value Date   HDL 51 08/06/2020   Lab Results  Component Value Date   LDLCALC 124 (H) 08/06/2020   Lab Results  Component Value Date   TRIG 140 08/06/2020   Lab Results  Component Value Date   CHOLHDL 3.2 06/11/2017   Lab Results  Component Value Date   HGBA1C 5.4 08/06/2020      Assessment & Plan:   Problem List Items Addressed This Visit       Genitourinary   Vaginal dryness, menopausal - Primary    Chronic, stable. Well controlled with estradiol cream twice a week as needed. Will send refill into pharmacy today. F/U if symptoms worsen or don't improve.        Meds ordered this encounter  Medications   estradiol (ESTRACE) 0.1 MG/GM vaginal cream    Sig: Place 1 Applicatorful vaginally at bedtime as needed.    Dispense:  42.5 g    Refill:  3    Follow-up: Return if symptoms worsen or fail to improve.    Charyl Dancer, NP

## 2020-10-26 ENCOUNTER — Ambulatory Visit (INDEPENDENT_AMBULATORY_CARE_PROVIDER_SITE_OTHER): Payer: Managed Care, Other (non HMO) | Admitting: Nurse Practitioner

## 2020-10-26 ENCOUNTER — Encounter: Payer: Self-pay | Admitting: Nurse Practitioner

## 2020-10-26 ENCOUNTER — Other Ambulatory Visit: Payer: Self-pay

## 2020-10-26 VITALS — BP 165/94 | HR 80 | Temp 98.3°F | Resp 18 | Wt 215.6 lb

## 2020-10-26 DIAGNOSIS — N951 Menopausal and female climacteric states: Secondary | ICD-10-CM | POA: Insufficient documentation

## 2020-10-26 MED ORDER — ESTRADIOL 0.1 MG/GM VA CREA: 1.0000 | TOPICAL_CREAM | Freq: Every evening | VAGINAL | 3 refills | Status: DC | PRN

## 2020-10-26 NOTE — Assessment & Plan Note (Signed)
Chronic, stable. Well controlled with estradiol cream twice a week as needed. Will send refill into pharmacy today. F/U if symptoms worsen or don't improve.

## 2020-10-28 ENCOUNTER — Other Ambulatory Visit: Payer: Self-pay | Admitting: Nurse Practitioner

## 2020-10-28 MED ORDER — ESTRADIOL 0.1 MG/GM VA CREA: 1.0000 | TOPICAL_CREAM | VAGINAL | 3 refills | Status: DC

## 2020-11-01 ENCOUNTER — Other Ambulatory Visit: Payer: Self-pay | Admitting: Nurse Practitioner

## 2020-11-01 ENCOUNTER — Telehealth: Payer: Self-pay | Admitting: Nurse Practitioner

## 2020-11-01 ENCOUNTER — Other Ambulatory Visit: Payer: Self-pay

## 2020-11-01 ENCOUNTER — Encounter: Payer: Self-pay | Admitting: Obstetrics and Gynecology

## 2020-11-01 ENCOUNTER — Ambulatory Visit (INDEPENDENT_AMBULATORY_CARE_PROVIDER_SITE_OTHER): Payer: Managed Care, Other (non HMO) | Admitting: Obstetrics and Gynecology

## 2020-11-01 VITALS — BP 122/86 | Ht 61.5 in | Wt 213.0 lb

## 2020-11-01 DIAGNOSIS — R87810 Cervical high risk human papillomavirus (HPV) DNA test positive: Secondary | ICD-10-CM | POA: Diagnosis not present

## 2020-11-01 NOTE — Telephone Encounter (Signed)
Pt calling in regarding medication, estradiol. She states that the pharmacy has reached out to office for clarification from PCP and that they have not received a call back. Please advise.

## 2020-11-01 NOTE — Progress Notes (Signed)
   GYNECOLOGY CLINIC COLPOSCOPY PROCEDURE NOTE  60 y.o. G0P0000 here for colposcopy for NIL and HR HPV+  pap smear on 09/06/2020 Discussed underlying role for HPV infection in the development of cervical dysplasia, its natural history and progression/regression, need for surveillance.  Is the patient  pregnant: No LMP: No LMP recorded. Patient is perimenopausal. Smoking status:  reports that she has never smoked. She has never used smokeless tobacco. Contraception: post menopausal status   Patient given informed consent, signed copy in the chart, time out was performed.  The patient was position in dorsal lithotomy position. Speculum was placed the cervix was visualized.   After application of acetic acid colposcopic inspection of the cervix was undertaken.   Colposcopy adequate, full visualization of transformation zone: Yes no visible lesions; corresponding biopsies obtained.   ECC specimen obtained:  Yes  All specimens were labeled and sent to pathology.   Patient was given post procedure instructions.  Will follow up pathology and manage accordingly.  Routine preventative health maintenance measures emphasized.  OBGyn Exam  Vena Austria, MD, [facility], [facility]

## 2020-11-02 NOTE — Telephone Encounter (Signed)
Received form from [facility] regarding this RX. Form was completed and faxed back.    Called and notified patient that this was done for her.

## 2020-11-03 LAB — ANATOMIC PATHOLOGY REPORT

## 2021-08-16 ENCOUNTER — Encounter: Payer: Self-pay | Admitting: Unknown Physician Specialty

## 2021-08-16 ENCOUNTER — Ambulatory Visit (INDEPENDENT_AMBULATORY_CARE_PROVIDER_SITE_OTHER): Payer: Managed Care, Other (non HMO) | Admitting: Unknown Physician Specialty

## 2021-08-16 VITALS — BP 140/88 | HR 71 | Temp 97.9°F | Ht 61.5 in | Wt 220.6 lb

## 2021-08-16 DIAGNOSIS — L930 Discoid lupus erythematosus: Secondary | ICD-10-CM | POA: Diagnosis not present

## 2021-08-16 DIAGNOSIS — N951 Menopausal and female climacteric states: Secondary | ICD-10-CM

## 2021-08-16 DIAGNOSIS — E6609 Other obesity due to excess calories: Secondary | ICD-10-CM

## 2021-08-16 DIAGNOSIS — Z0000 Encounter for general adult medical examination without abnormal findings: Secondary | ICD-10-CM | POA: Diagnosis not present

## 2021-08-16 DIAGNOSIS — Z6839 Body mass index (BMI) 39.0-39.9, adult: Secondary | ICD-10-CM

## 2021-08-16 MED ORDER — ESTRADIOL 0.1 MG/GM VA CREA: 1.0000 | TOPICAL_CREAM | VAGINAL | 3 refills | Status: DC

## 2021-08-16 MED ORDER — SEMAGLUTIDE-WEIGHT MANAGEMENT 1 MG/0.5ML ~~LOC~~ SOAJ: 1.0000 mg | SUBCUTANEOUS | 0 refills | Status: AC

## 2021-08-16 MED ORDER — SEMAGLUTIDE-WEIGHT MANAGEMENT 1.7 MG/0.75ML ~~LOC~~ SOAJ: 1.7000 mg | SUBCUTANEOUS | 0 refills | Status: AC

## 2021-08-16 MED ORDER — SEMAGLUTIDE-WEIGHT MANAGEMENT 0.5 MG/0.5ML ~~LOC~~ SOAJ: 0.5000 mg | SUBCUTANEOUS | 0 refills | Status: AC

## 2021-08-16 MED ORDER — SEMAGLUTIDE-WEIGHT MANAGEMENT 2.4 MG/0.75ML ~~LOC~~ SOAJ: 2.4000 mg | SUBCUTANEOUS | 0 refills | Status: AC

## 2021-08-16 MED ORDER — SEMAGLUTIDE-WEIGHT MANAGEMENT 0.25 MG/0.5ML ~~LOC~~ SOAJ: 0.2500 mg | SUBCUTANEOUS | 0 refills | Status: AC

## 2021-08-16 NOTE — Assessment & Plan Note (Signed)
Takes Plaquenel through the rheumatologist

## 2021-08-16 NOTE — Assessment & Plan Note (Addendum)
BMI over 40.  Will try [facility] though pt aware it is on back order. Will see if her insurance is willing to cover. Pt did have a localized rash with Saxenda.  I'm not sure it was related

## 2021-08-16 NOTE — Assessment & Plan Note (Signed)
Would like a refill of Estrace.

## 2021-08-16 NOTE — Progress Notes (Signed)
BP 140/88   Pulse 71   Temp 97.9 F (36.6 C) (Oral)   Ht 5' 1.5" (1.562 m)   Wt 220 lb 9.6 oz (100.1 kg)   SpO2 98%   BMI 41.01 kg/m    Subjective:    Patient ID: Stacey Marshall, female    DOB: 1960-12-27, 61 y.o.   MRN: [id number]  HPI: Stacey Marshall is a 61 y.o. female  Chief Complaint  Patient presents with   Annual Exam   Obesity: states she struggles with weight up and down.  Has tried Saxenda in the past but had a rash and taken off.  However rash only on one side of the body on abdoman.  However, had only been on it a week at that time.    Family History  Problem Relation Age of Onset   Diabetes Mother    Hypertension Mother    AAA (abdominal aortic aneurysm) Mother    Diabetes Father    Hypertension Father    Hypertension Sister    Diabetes Sister    Hypertension Sister    Hypertension Brother    Hyperlipidemia Brother    Diabetes Brother    Social History   Socioeconomic History   Marital status: Single    Spouse name: Not on file   Number of children: Not on file   Years of education: Not on file   Highest education level: Not on file  Occupational History   Occupation: [employer]  Tobacco Use   Smoking status: Never   Smokeless tobacco: Never  Vaping Use   Vaping Use: Never used  Substance and Sexual Activity   Alcohol use: Yes    Alcohol/week: 2.0 - 3.0 standard drinks of alcohol    Types: 2 - 3 Glasses of wine per week   Drug use: No   Sexual activity: Not Currently    Birth control/protection: None  Other Topics Concern   Not on file  Social History Narrative   Not on file   Social Determinants of Health   Financial Resource Strain: Not on file  Food Insecurity: Not on file  Transportation Needs: Not on file  Physical Activity: Not on file  Stress: Not on file  Social Connections: Not on file  Intimate Partner Violence: Not on file   Past Medical History:  Diagnosis Date   Back pain    Discoid lupus     Joint pain    Obesity    Rheumatoid arthritis ([place])     Relevant past medical, surgical, family and social history reviewed and updated as indicated. Interim medical history since our last visit reviewed. Allergies and medications reviewed and updated.  Review of Systems  Constitutional: Negative.        Struggles with your weight up and down  HENT: Negative.    Eyes: Negative.   Respiratory: Negative.    Cardiovascular: Negative.   Gastrointestinal: Negative.   Endocrine: Negative.   Genitourinary: Negative.   Musculoskeletal: Negative.   Skin: Negative.   Allergic/Immunologic: Negative.   Neurological: Negative.   Hematological: Negative.   Psychiatric/Behavioral: Negative.      Per HPI unless specifically indicated above     Objective:    BP 140/88   Pulse 71   Temp 97.9 F (36.6 C) (Oral)   Ht 5' 1.5" (1.562 m)   Wt 220 lb 9.6 oz (100.1 kg)   SpO2 98%   BMI 41.01 kg/m   Wt Readings from Last 3 Encounters:  08/16/21 220 lb 9.6 oz (100.1 kg)  11/01/20 213 lb (96.6 kg)  10/26/20 215 lb 9.6 oz (97.8 kg)    Physical Exam Constitutional:      General: She is not in acute distress.    Appearance: Normal appearance. She is well-developed.  HENT:     Head: Normocephalic and atraumatic.  Eyes:     General: Lids are normal. No scleral icterus.       Right eye: No discharge.        Left eye: No discharge.     Conjunctiva/sclera: Conjunctivae normal.  Neck:     Vascular: No carotid bruit or JVD.  Cardiovascular:     Rate and Rhythm: Normal rate and regular rhythm.     Heart sounds: Normal heart sounds.  Pulmonary:     Effort: Pulmonary effort is normal. No respiratory distress.     Breath sounds: Normal breath sounds.  Abdominal:     Palpations: There is no hepatomegaly or splenomegaly.  Musculoskeletal:        General: Normal range of motion.     Cervical back: Normal range of motion and neck supple.  Skin:    General: Skin is warm and dry.      Coloration: Skin is not pale.     Findings: No rash.  Neurological:     Mental Status: She is alert and oriented to person, place, and time.  Psychiatric:        Behavior: Behavior normal.        Thought Content: Thought content normal.        Judgment: Judgment normal.     Results for orders placed or performed in visit on 11/01/20  Pathology [facility])  Result Value Ref Range   Diagnosis synopsis: Comment    Specimen: Comment    Diagnosis: Comment    Gross description: Comment    Electronically signed by: Comment    CPT code(s): Comment    CPT Disclaimer: Comment    Clinician provided ICD: Comment    Pathologist provided ICD: B97.7       Assessment & Plan:   Problem List Items Addressed This Visit       Unprioritized   Discoid lupus    Takes Plaquenel through the rheumatologist      Obesity    BMI over 40.  Will try [facility] though pt aware it is on back order. Will see if her insurance is willing to cover. Pt did have a localized rash with Saxenda.  I'm not sure it was related      Relevant Medications   Semaglutide-Weight Management 0.25 MG/0.5ML SOAJ   Semaglutide-Weight Management 0.5 MG/0.5ML SOAJ (Start on 09/14/2021)   Semaglutide-Weight Management 1 MG/0.5ML SOAJ (Start on 10/13/2021)   Semaglutide-Weight Management 1.7 MG/0.75ML SOAJ (Start on 11/11/2021)   Semaglutide-Weight Management 2.4 MG/0.75ML SOAJ (Start on 12/10/2021)   Vaginal dryness, menopausal    Would like a refill of Estrace.      Other Visit Diagnoses     Routine medical exam    -  Primary   Health maintenance items up to date.  Desires STD screening.  HIV, GC, and Chlamydia checked.    Relevant Orders   MM DIAG BREAST TOMO BILATERAL   CBC with Differential/Platelet   Comprehensive metabolic panel   TSH   Lipid Panel w/o Chol/HDL Ratio   HIV Antibody (routine testing w rflx)   GC/Chlamydia Probe Amp([facility])        Follow up plan: Return  in about 3 months (around  11/16/2021).

## 2021-08-17 LAB — CBC WITH DIFFERENTIAL/PLATELET
Basophils Absolute: 0 10*3/uL (ref 0.0–0.2)
Basos: 1 %
EOS (ABSOLUTE): 0.3 10*3/uL (ref 0.0–0.4)
Eos: 5 %
Hematocrit: 39.5 % (ref 34.0–46.6)
Hemoglobin: 13.8 g/dL (ref 11.1–15.9)
Immature Grans (Abs): 0 10*3/uL (ref 0.0–0.1)
Immature Granulocytes: 0 %
Lymphocytes Absolute: 3.1 10*3/uL (ref 0.7–3.1)
Lymphs: 48 %
MCH: 31.9 pg (ref 26.6–33.0)
MCHC: 34.9 g/dL (ref 31.5–35.7)
MCV: 91 fL (ref 79–97)
Monocytes Absolute: 0.5 10*3/uL (ref 0.1–0.9)
Monocytes: 7 %
Neutrophils Absolute: 2.5 10*3/uL (ref 1.4–7.0)
Neutrophils: 39 %
Platelets: 197 10*3/uL (ref 150–450)
RBC: 4.32 x10E6/uL (ref 3.77–5.28)
RDW: 13.4 % (ref 11.7–15.4)
WBC: 6.5 10*3/uL (ref 3.4–10.8)

## 2021-08-17 LAB — LIPID PANEL W/O CHOL/HDL RATIO
Cholesterol, Total: 206 mg/dL — ABNORMAL HIGH (ref 100–199)
HDL: 63 mg/dL (ref >39)
LDL Chol Calc (NIH): 125 mg/dL — ABNORMAL HIGH (ref 0–99)
Triglycerides: 100 mg/dL (ref 0–149)
VLDL Cholesterol Cal: 18 mg/dL (ref 5–40)

## 2021-08-17 LAB — COMPREHENSIVE METABOLIC PANEL
ALT: 25 IU/L (ref 0–32)
AST: 28 IU/L (ref 0–40)
Albumin/Globulin Ratio: 1.3 (ref 1.2–2.2)
Albumin: 4.3 g/dL (ref 3.8–4.9)
Alkaline Phosphatase: 78 IU/L (ref 44–121)
BUN/Creatinine Ratio: 16 (ref 9–23)
BUN: 14 mg/dL (ref 6–24)
Bilirubin Total: 0.3 mg/dL (ref 0.0–1.2)
CO2: 22 mmol/L (ref 20–29)
Calcium: 9.8 mg/dL (ref 8.7–10.2)
Chloride: 102 mmol/L (ref 96–106)
Creatinine, Ser: 0.9 mg/dL (ref 0.57–1.00)
Globulin, Total: 3.4 g/dL (ref 1.5–4.5)
Glucose: 80 mg/dL (ref 70–99)
Potassium: 4.1 mmol/L (ref 3.5–5.2)
Sodium: 139 mmol/L (ref 134–144)
Total Protein: 7.7 g/dL (ref 6.0–8.5)
eGFR: 74 mL/min/{1.73_m2} (ref >59)

## 2021-08-17 LAB — HIV ANTIBODY (ROUTINE TESTING W REFLEX): HIV Screen 4th Generation wRfx: NONREACTIVE

## 2021-08-17 LAB — TSH: TSH: 1.62 u[IU]/mL (ref 0.450–4.500)

## 2021-08-18 LAB — GC/CHLAMYDIA PROBE AMP
Chlamydia trachomatis, NAA: NEGATIVE
Neisseria Gonorrhoeae by PCR: NEGATIVE

## 2021-08-18 NOTE — Progress Notes (Signed)
Contacted via MyChart   Good evening Stacey Marshall, your gonorrhea and chlamydia testing has returned negative.  Great news!!

## 2021-08-19 ENCOUNTER — Telehealth: Payer: Self-pay

## 2021-08-19 NOTE — Telephone Encounter (Signed)
Error

## 2021-08-24 NOTE — Telephone Encounter (Signed)
Patient called in requesting the provided to write an appeal letter so the insurance copy will pay for her [facility] prescription.Please assist patient further

## 2021-08-25 NOTE — Telephone Encounter (Signed)
PA denied by the patient's insurance. E-Appeal submitted to the patient's insurance. Awaiting determination from insurance.   Called and notified patient that appeal has been sent.

## 2021-08-25 NOTE — Telephone Encounter (Signed)
Could not fine where the PA for the 0.25 mg was completed. PA for this dose initiated and submitted via Cover My Meds. Key: [id number]

## 2021-08-30 NOTE — Telephone Encounter (Signed)
Received fax stating that this prescription can now be filled and that it is valid through 04/02/2022.   Called and notified patient of this.

## 2021-09-12 LAB — HM MAMMOGRAPHY

## 2021-09-13 ENCOUNTER — Encounter: Payer: Self-pay | Admitting: Family

## 2021-09-14 ENCOUNTER — Telehealth: Payer: Self-pay | Admitting: Family

## 2021-09-14 ENCOUNTER — Encounter (INDEPENDENT_AMBULATORY_CARE_PROVIDER_SITE_OTHER): Payer: Self-pay

## 2021-09-14 NOTE — Telephone Encounter (Signed)
Patient called in says no pharmacy has wegovy, so she is asking for a replacement for this. Please call back. Pharmacy she uses is  [id number] - [place], [place] - [address] AT [facility] [address] Phone:  [phone]  Fax:  [phone]

## 2021-09-15 ENCOUNTER — Other Ambulatory Visit: Payer: Self-pay | Admitting: Unknown Physician Specialty

## 2021-09-15 NOTE — Telephone Encounter (Signed)
Medication Refill - Medication: estradiol (ESTRACE) 0.1 MG/GM vaginal cream  Has the patient contacted their pharmacy? Yes.   (Agent: If no, request that the patient contact the pharmacy for the refill. If patient does not wish to contact the pharmacy document the reason why and proceed with request.) (Agent: If yes, when and what did the pharmacy advise?) Pt states this was given to her in her visit mid July. She stated that pharmacy had sent additional paperwork to [facility] to sign off and and now she was told the dr never responded so they removed request. Pls advise pt next step [phone]  Preferred Pharmacy (with phone number or street name):  [facility]) [place], [place] - [address] [facility]  [address] [place] [address] [place] [address]  Phone: [phone] Fax: [phone]   Has the patient been seen for an appointment in the last year OR does the patient have an upcoming appointment? Yes.    Agent: Please be advised that RX refills may take up to 3 business days. We ask that you follow-up with your pharmacy.

## 2021-09-15 NOTE — Telephone Encounter (Signed)
Pt was expecting cb yesterday, she has called all the Drugstores in [place], there is no one yet that has a supply. Pls advice another med? Fu [phone]

## 2021-09-16 ENCOUNTER — Telehealth: Payer: Self-pay

## 2021-09-16 NOTE — Telephone Encounter (Signed)
Patient made aware of results and verbalized understanding.  

## 2021-09-16 NOTE — Telephone Encounter (Signed)
Rx was sent in in July for 1 year. Should not be due- please make sure pharmacy received Rx.

## 2021-09-19 NOTE — Telephone Encounter (Signed)
Attempted to contact patient in regards to refill request, to see if she has received medication. NA LVM for patient to call back.

## 2021-09-20 NOTE — Telephone Encounter (Signed)
error 

## 2021-09-20 NOTE — Telephone Encounter (Signed)
Called patient to discuss Wegovy, no answer, unable to leave a voicemail for patient to return my call.    Ok for nurse triage to give results if patient calls back.

## 2021-09-21 ENCOUNTER — Telehealth: Payer: Self-pay

## 2021-09-21 NOTE — Telephone Encounter (Signed)
Copied from CRM [id number]. Topic: General - Other >> Sep 20, 2021  4:44 PM Ja-Kwan M wrote: Reason for CRM: Pt stated the Reginal Lutes is on backorder so she wanted ask if there was another medication that she can be prescribed.   Previous Dr. Charlotta Newton patient, medication prescribed by [facility].

## 2021-09-22 NOTE — Telephone Encounter (Signed)
Returned patient call and advised of provider advise, patient states she will just check in again with the pharmacy in a few weeks to see if they have it.

## 2021-09-22 NOTE — Telephone Encounter (Signed)
Per patient [facility] cancelled prescription because they were waiting to hear back from provider in regards to how the rx was written. Will need to resend new prescription to [facility].

## 2021-09-22 NOTE — Telephone Encounter (Signed)
No unfortunately there really is not. I'd advise her that she may have to wait on the wegovy or she can call around to other pharmacies and see if they have it

## 2021-09-23 MED ORDER — ESTRADIOL 0.1 MG/GM VA CREA: 1.0000 | TOPICAL_CREAM | VAGINAL | 3 refills | Status: DC

## 2021-11-22 ENCOUNTER — Ambulatory Visit: Payer: Managed Care, Other (non HMO) | Admitting: Physician Assistant

## 2021-12-06 ENCOUNTER — Ambulatory Visit: Payer: Managed Care, Other (non HMO) | Admitting: Unknown Physician Specialty

## 2021-12-13 ENCOUNTER — Encounter: Payer: Self-pay | Admitting: Unknown Physician Specialty

## 2021-12-13 ENCOUNTER — Ambulatory Visit: Payer: Managed Care, Other (non HMO) | Admitting: Unknown Physician Specialty

## 2021-12-13 VITALS — BP 140/83 | HR 84 | Temp 98.2°F | Wt 211.2 lb

## 2021-12-13 DIAGNOSIS — F439 Reaction to severe stress, unspecified: Secondary | ICD-10-CM

## 2021-12-13 DIAGNOSIS — F5101 Primary insomnia: Secondary | ICD-10-CM | POA: Diagnosis not present

## 2021-12-13 DIAGNOSIS — E6609 Other obesity due to excess calories: Secondary | ICD-10-CM

## 2021-12-13 DIAGNOSIS — R5383 Other fatigue: Secondary | ICD-10-CM

## 2021-12-13 DIAGNOSIS — Z6839 Body mass index (BMI) 39.0-39.9, adult: Secondary | ICD-10-CM

## 2021-12-13 NOTE — Progress Notes (Signed)
BP (!) 140/83   Pulse 84   Temp 98.2 F (36.8 C) (Oral)   Wt 211 lb 3.2 oz (95.8 kg)   SpO2 98%   BMI 39.26 kg/m    Subjective:    Patient ID: Stacey Marshall, female    DOB: 1960-06-09, 61 y.o.   MRN: [id number]  HPI: Stacey Marshall is a 61 y.o. female  Chief Complaint  Patient presents with   Weight Check    3 month f/up- pt states she is still on the 1.7 mg wegovy. States she had a late start to the medication because of the shortage.    Pt is currently taking Wegovy.  States she is past the nausea and fatigue.  States she has not appetite at all and is concerned that it is "too much." Forcing herself to eat and in general taking in less than 500 calories.  She admits to not really exercising due to fatigue.  Lost 9 pounds in the last 3 months.  Admits that knee is better.  Admits to not sleeping well    Relevant past medical, surgical, family and social history reviewed and updated as indicated. Interim medical history since our last visit reviewed. Allergies and medications reviewed and updated.  Review of Systems  Constitutional: Negative.   Respiratory: Negative.    Skin: Negative.   Psychiatric/Behavioral: Negative.      Per HPI unless specifically indicated above     Objective:    BP (!) 140/83   Pulse 84   Temp 98.2 F (36.8 C) (Oral)   Wt 211 lb 3.2 oz (95.8 kg)   SpO2 98%   BMI 39.26 kg/m   Wt Readings from Last 3 Encounters:  12/13/21 211 lb 3.2 oz (95.8 kg)  08/16/21 220 lb 9.6 oz (100.1 kg)  11/01/20 213 lb (96.6 kg)    Physical Exam Constitutional:      General: She is not in acute distress.    Appearance: Normal appearance. She is well-developed.  HENT:     Head: Normocephalic and atraumatic.  Eyes:     General: Lids are normal. No scleral icterus.       Right eye: No discharge.        Left eye: No discharge.     Conjunctiva/sclera: Conjunctivae normal.  Neck:     Vascular: No carotid bruit or JVD.  Cardiovascular:     Rate  and Rhythm: Normal rate and regular rhythm.     Heart sounds: Normal heart sounds.  Pulmonary:     Effort: Pulmonary effort is normal. No respiratory distress.     Breath sounds: Normal breath sounds.  Abdominal:     Palpations: There is no hepatomegaly or splenomegaly.  Musculoskeletal:        General: Normal range of motion.     Cervical back: Normal range of motion and neck supple.  Skin:    General: Skin is warm and dry.     Coloration: Skin is not pale.     Findings: No rash.  Neurological:     Mental Status: She is alert and oriented to person, place, and time.  Psychiatric:        Behavior: Behavior normal.        Thought Content: Thought content normal.        Judgment: Judgment normal.     Results for orders placed or performed in visit on 09/13/21  HM MAMMOGRAPHY  Result Value Ref Range   HM Mammogram 0-4  Bi-Rad 0-4 Bi-Rad, Self Reported Normal      Assessment & Plan:   Problem List Items Addressed This Visit       Unprioritized   Obesity - Primary    Pt is on 1.7 mg dose.  On this dose she is hardly eating and agree with not going up.  At this time, she doesn't want to go down and finish what she has and then follow-up.  She is concerned about her nutritional status.  Recommend take a MVI.  See recommendations for stress.        Relevant Orders   CBC with Differential/Platelet   TSH   VITAMIN D 25 Hydroxy (Vit-D Deficiency, Fractures)   Other Visit Diagnoses     Primary insomnia       Magnesium and Ashwaghanda will help. Discussed no electronics starting 2 hours before betime.   Stress       Start Ashwaghanda twice a day along with Magnesium Glycinate to help with sleep.  Meditation 3 days/week.   Relevant Orders   TSH   Other fatigue       Relevant Orders   CBC with Differential/Platelet   Comprehensive metabolic panel   VITAMIN D 25 Hydroxy (Vit-D Deficiency, Fractures)   B12         Follow up plan: Return in about 4 weeks (around  01/10/2022).

## 2021-12-13 NOTE — Patient Instructions (Addendum)
Ashwaghanda - 300 mg twice a day  1. Magnesium Glycinate at night  2. Melatonin 1-3 mg at night.    3.Valerian root  3. Glycine

## 2021-12-13 NOTE — Assessment & Plan Note (Addendum)
Pt is on 1.7 mg dose.  On this dose she is hardly eating and agree with not going up.  At this time, she doesn't want to go down and finish what she has and then follow-up.  She is concerned about her nutritional status.  Recommend take a MVI.  See recommendations for stress.

## 2021-12-14 LAB — COMPREHENSIVE METABOLIC PANEL
ALT: 17 IU/L (ref 0–32)
AST: 17 IU/L (ref 0–40)
Albumin/Globulin Ratio: 1.4 (ref 1.2–2.2)
Albumin: 4.2 g/dL (ref 3.8–4.9)
Alkaline Phosphatase: 78 IU/L (ref 44–121)
BUN/Creatinine Ratio: 14 (ref 9–23)
BUN: 14 mg/dL (ref 6–24)
Bilirubin Total: 0.2 mg/dL (ref 0.0–1.2)
CO2: 23 mmol/L (ref 20–29)
Calcium: 9.9 mg/dL (ref 8.7–10.2)
Chloride: 103 mmol/L (ref 96–106)
Creatinine, Ser: 1.03 mg/dL — ABNORMAL HIGH (ref 0.57–1.00)
Globulin, Total: 3 g/dL (ref 1.5–4.5)
Glucose: 90 mg/dL (ref 70–99)
Potassium: 4.4 mmol/L (ref 3.5–5.2)
Sodium: 140 mmol/L (ref 134–144)
Total Protein: 7.2 g/dL (ref 6.0–8.5)
eGFR: 63 mL/min/{1.73_m2} (ref >59)

## 2021-12-14 LAB — CBC WITH DIFFERENTIAL/PLATELET
Basophils Absolute: 0 10*3/uL (ref 0.0–0.2)
Basos: 1 %
EOS (ABSOLUTE): 0.3 10*3/uL (ref 0.0–0.4)
Eos: 4 %
Hematocrit: 39.4 % (ref 34.0–46.6)
Hemoglobin: 13.2 g/dL (ref 11.1–15.9)
Immature Grans (Abs): 0 10*3/uL (ref 0.0–0.1)
Immature Granulocytes: 0 %
Lymphocytes Absolute: 3.4 10*3/uL — ABNORMAL HIGH (ref 0.7–3.1)
Lymphs: 42 %
MCH: 31.2 pg (ref 26.6–33.0)
MCHC: 33.5 g/dL (ref 31.5–35.7)
MCV: 93 fL (ref 79–97)
Monocytes Absolute: 0.6 10*3/uL (ref 0.1–0.9)
Monocytes: 7 %
Neutrophils Absolute: 3.7 10*3/uL (ref 1.4–7.0)
Neutrophils: 46 %
Platelets: 203 10*3/uL (ref 150–450)
RBC: 4.23 x10E6/uL (ref 3.77–5.28)
RDW: 13.8 % (ref 11.7–15.4)
WBC: 8 10*3/uL (ref 3.4–10.8)

## 2021-12-14 LAB — TSH: TSH: 1.38 u[IU]/mL (ref 0.450–4.500)

## 2021-12-14 LAB — VITAMIN B12: Vitamin B-12: 569 pg/mL (ref 232–1245)

## 2021-12-14 LAB — VITAMIN D 25 HYDROXY (VIT D DEFICIENCY, FRACTURES): Vit D, 25-Hydroxy: 30.4 ng/mL (ref 30.0–100.0)

## 2022-01-16 ENCOUNTER — Ambulatory Visit: Payer: Managed Care, Other (non HMO) | Admitting: Nurse Practitioner

## 2022-01-17 ENCOUNTER — Encounter: Payer: Self-pay | Admitting: Unknown Physician Specialty

## 2022-01-17 ENCOUNTER — Ambulatory Visit (INDEPENDENT_AMBULATORY_CARE_PROVIDER_SITE_OTHER): Payer: Managed Care, Other (non HMO) | Admitting: Unknown Physician Specialty

## 2022-01-17 VITALS — BP 118/81 | HR 86 | Temp 98.5°F | Ht 61.5 in | Wt 204.9 lb

## 2022-01-17 DIAGNOSIS — Z6838 Body mass index (BMI) 38.0-38.9, adult: Secondary | ICD-10-CM | POA: Diagnosis not present

## 2022-01-17 DIAGNOSIS — E6609 Other obesity due to excess calories: Secondary | ICD-10-CM

## 2022-01-17 DIAGNOSIS — F5101 Primary insomnia: Secondary | ICD-10-CM

## 2022-01-17 MED ORDER — WEGOVY 2.4 MG/0.75ML ~~LOC~~ SOAJ
2.4000 mg | SUBCUTANEOUS | 3 refills | Status: DC
Start: 2022-01-17 — End: 2022-03-20

## 2022-01-17 NOTE — Progress Notes (Signed)
BP 118/81   Pulse 86   Temp 98.5 F (36.9 C) (Oral)   Ht 5' 1.5" (1.562 m)   Wt 204 lb 14.4 oz (92.9 kg)   SpO2 96%   BMI 38.09 kg/m    Subjective:    Patient ID: Stacey Marshall, female    DOB: Jan 15, 1961, 61 y.o.   MRN: [id number]  HPI: Stacey Marshall is a 61 y.o. female  Chief Complaint  Patient presents with   Insomnia    Patient says she is doing better. Patient says she has made her a routine and it is getting better.    Obesity Taking Wegovy 2.4% Earlier unable to eat but is now eating and making good choices.  Lost 7 pounds in the last month.  Happier now she is eating something.  Switched jobs and work hours to decrease stess.    Insomnia  This has improved with decreased stress.  Ashwaghanda and Magnesium make her dizzy.  Therefore well with new job and meditating.    Relevant past medical, surgical, family and social history reviewed and updated as indicated. Interim medical history since our last visit reviewed. Allergies and medications reviewed and updated.  Review of Systems  Constitutional: Negative.   Respiratory: Negative.    Gastrointestinal: Negative.   Psychiatric/Behavioral: Negative.      Per HPI unless specifically indicated above     Objective:    BP 118/81   Pulse 86   Temp 98.5 F (36.9 C) (Oral)   Ht 5' 1.5" (1.562 m)   Wt 204 lb 14.4 oz (92.9 kg)   SpO2 96%   BMI 38.09 kg/m   Wt Readings from Last 3 Encounters:  01/17/22 204 lb 14.4 oz (92.9 kg)  12/13/21 211 lb 3.2 oz (95.8 kg)  08/16/21 220 lb 9.6 oz (100.1 kg)    Physical Exam Constitutional:      General: She is not in acute distress.    Appearance: Normal appearance. She is well-developed.  HENT:     Head: Normocephalic and atraumatic.  Eyes:     General: Lids are normal. No scleral icterus.       Right eye: No discharge.        Left eye: No discharge.     Conjunctiva/sclera: Conjunctivae normal.  Neck:     Vascular: No carotid bruit or JVD.   Cardiovascular:     Rate and Rhythm: Normal rate and regular rhythm.     Heart sounds: Normal heart sounds.  Pulmonary:     Effort: Pulmonary effort is normal. No respiratory distress.     Breath sounds: Normal breath sounds.  Abdominal:     Palpations: There is no hepatomegaly or splenomegaly.  Musculoskeletal:        General: Normal range of motion.     Cervical back: Normal range of motion and neck supple.  Skin:    General: Skin is warm and dry.     Coloration: Skin is not pale.     Findings: No rash.  Neurological:     Mental Status: She is alert and oriented to person, place, and time.  Psychiatric:        Behavior: Behavior normal.        Thought Content: Thought content normal.        Judgment: Judgment normal.     Results for orders placed or performed in visit on 12/13/21  CBC with Differential/Platelet  Result Value Ref Range   WBC 8.0 3.4 -  10.8 x10E3/uL   RBC 4.23 3.77 - 5.28 x10E6/uL   Hemoglobin 13.2 11.1 - 15.9 g/dL   Hematocrit 39.4 34.0 - 46.6 %   MCV 93 79 - 97 fL   MCH 31.2 26.6 - 33.0 pg   MCHC 33.5 31.5 - 35.7 g/dL   RDW 13.8 11.7 - 15.4 %   Platelets 203 150 - 450 x10E3/uL   Neutrophils 46 Not Estab. %   Lymphs 42 Not Estab. %   Monocytes 7 Not Estab. %   Eos 4 Not Estab. %   Basos 1 Not Estab. %   Neutrophils Absolute 3.7 1.4 - 7.0 x10E3/uL   Lymphocytes Absolute 3.4 (H) 0.7 - 3.1 x10E3/uL   Monocytes Absolute 0.6 0.1 - 0.9 x10E3/uL   EOS (ABSOLUTE) 0.3 0.0 - 0.4 x10E3/uL   Basophils Absolute 0.0 0.0 - 0.2 x10E3/uL   Immature Granulocytes 0 Not Estab. %   Immature Grans (Abs) 0.0 0.0 - 0.1 x10E3/uL  Comprehensive metabolic panel  Result Value Ref Range   Glucose 90 70 - 99 mg/dL   BUN 14 6 - 24 mg/dL   Creatinine, Ser 1.03 (H) 0.57 - 1.00 mg/dL   eGFR 63 >59 mL/min/1.73   BUN/Creatinine Ratio 14 9 - 23   Sodium 140 134 - 144 mmol/L   Potassium 4.4 3.5 - 5.2 mmol/L   Chloride 103 96 - 106 mmol/L   CO2 23 20 - 29 mmol/L   Calcium 9.9  8.7 - 10.2 mg/dL   Total Protein 7.2 6.0 - 8.5 g/dL   Albumin 4.2 3.8 - 4.9 g/dL   Globulin, Total 3.0 1.5 - 4.5 g/dL   Albumin/Globulin Ratio 1.4 1.2 - 2.2   Bilirubin Total 0.2 0.0 - 1.2 mg/dL   Alkaline Phosphatase 78 44 - 121 IU/L   AST 17 0 - 40 IU/L   ALT 17 0 - 32 IU/L  TSH  Result Value Ref Range   TSH 1.380 0.450 - 4.500 uIU/mL  VITAMIN D 25 Hydroxy (Vit-D Deficiency, Fractures)  Result Value Ref Range   Vit D, 25-Hydroxy 30.4 30.0 - 100.0 ng/mL  B12  Result Value Ref Range   Vitamin B-12 569 232 - 1,245 pg/mL      Assessment & Plan:   Problem List Items Addressed This Visit       Unprioritized   Obesity - Primary    Doing well with current dose of Semaglutide.  Continue 2.7%.  Will recheck 6 weeks      Relevant Medications   Semaglutide-Weight Management (WEGOVY) 2.4 MG/0.75ML SOAJ   Other Visit Diagnoses     Primary insomnia       Improved with decreased stress.  Will continue stress reduction and meditation.        Follow up plan: Return in about 6 weeks (around 02/28/2022).

## 2022-01-17 NOTE — Assessment & Plan Note (Signed)
Doing well with current dose of Semaglutide.  Continue 2.7%.  Will recheck 6 weeks

## 2022-02-28 ENCOUNTER — Ambulatory Visit: Payer: Managed Care, Other (non HMO) | Admitting: Nurse Practitioner

## 2022-03-20 ENCOUNTER — Encounter: Payer: Self-pay | Admitting: Nurse Practitioner

## 2022-03-20 ENCOUNTER — Ambulatory Visit: Payer: Managed Care, Other (non HMO) | Admitting: Nurse Practitioner

## 2022-03-20 VITALS — BP 134/83 | HR 86 | Temp 98.1°F | Wt 197.4 lb

## 2022-03-20 DIAGNOSIS — Z6841 Body Mass Index (BMI) 40.0 and over, adult: Secondary | ICD-10-CM

## 2022-03-20 DIAGNOSIS — E7800 Pure hypercholesterolemia, unspecified: Secondary | ICD-10-CM

## 2022-03-20 DIAGNOSIS — M069 Rheumatoid arthritis, unspecified: Secondary | ICD-10-CM | POA: Insufficient documentation

## 2022-03-20 DIAGNOSIS — E559 Vitamin D deficiency, unspecified: Secondary | ICD-10-CM | POA: Diagnosis not present

## 2022-03-20 DIAGNOSIS — R7303 Prediabetes: Secondary | ICD-10-CM | POA: Diagnosis not present

## 2022-03-20 DIAGNOSIS — L930 Discoid lupus erythematosus: Secondary | ICD-10-CM

## 2022-03-20 DIAGNOSIS — E6609 Other obesity due to excess calories: Secondary | ICD-10-CM

## 2022-03-20 DIAGNOSIS — Z6838 Body mass index (BMI) 38.0-38.9, adult: Secondary | ICD-10-CM

## 2022-03-20 DIAGNOSIS — E66813 Obesity, class 3: Secondary | ICD-10-CM

## 2022-03-20 MED ORDER — WEGOVY 2.4 MG/0.75ML ~~LOC~~ SOAJ: 2.4000 mg | SUBCUTANEOUS | 1 refills | Status: DC

## 2022-03-20 NOTE — Assessment & Plan Note (Signed)
Labs ordered at visit today.  Will make recommendations based on lab results.   

## 2022-03-20 NOTE — Assessment & Plan Note (Signed)
Chronic.  Controlled.  Continue with current medication regimen of Plaquenil.  Labs ordered today.  Return to clinic in 6 months for reevaluation.  Call sooner if concerns arise.

## 2022-03-20 NOTE — Progress Notes (Signed)
BP 134/83   Pulse 86   Temp 98.1 F (36.7 C) (Oral)   Wt 197 lb 6.4 oz (89.5 kg)   BMI 36.69 kg/m    Subjective:    Patient ID: Stacey Marshall, female    DOB: 10/16/1960, 62 y.o.   MRN: [id number]  HPI: Stacey Marshall is a 62 y.o. female  Chief Complaint  Patient presents with   Obesity   HYPERLIPIDEMIA Hyperlipidemia status: excellent compliance Satisfied with current treatment?  yes Side effects:  no Medication compliance: excellent compliance Past cholesterol meds: none Supplements: none Aspirin:  no The 10-year ASCVD risk score (Arnett DK, et al., 2019) is: 5.1%   Values used to calculate the score:     Age: 37 years     Sex: Female     Is Non-Hispanic African American: Yes     Diabetic: No     Tobacco smoker: No     Systolic Blood Pressure: [id number] mmHg     Is BP treated: No     HDL Cholesterol: 63 mg/dL     Total Cholesterol: 206 mg/dL Chest pain:  no Coronary artery disease:  no Family history CAD:  no Family history early CAD:  no  WEIGHT MANAGEMENT States she is doing well with [id number].  Has lost two pant sizes.   Duration: years Previous attempts at weight loss: yes Complications of obesity:HLD Peak weight: 211lb Down two pants sizes   Denies HA, CP, SOB, dizziness, palpitations, visual changes, and lower extremity swelling.   Relevant past medical, surgical, family and social history reviewed and updated as indicated. Interim medical history since our last visit reviewed. Allergies and medications reviewed and updated.  Review of Systems  Eyes:  Negative for visual disturbance.  Respiratory:  Negative for cough, chest tightness and shortness of breath.   Cardiovascular:  Negative for chest pain, palpitations and leg swelling.  Neurological:  Negative for dizziness and headaches.    Per HPI unless specifically indicated above     Objective:    BP 134/83   Pulse 86   Temp 98.1 F (36.7 C) (Oral)   Wt 197 lb 6.4 oz (89.5 kg)    BMI 36.69 kg/m   Wt Readings from Last 3 Encounters:  03/20/22 197 lb 6.4 oz (89.5 kg)  01/17/22 204 lb 14.4 oz (92.9 kg)  12/13/21 211 lb 3.2 oz (95.8 kg)    Physical Exam Vitals and nursing note reviewed.  Constitutional:      General: She is not in acute distress.    Appearance: Normal appearance. She is obese. She is not ill-appearing, toxic-appearing or diaphoretic.  HENT:     Head: Normocephalic.     Right Ear: External ear normal.     Left Ear: External ear normal.     Nose: Nose normal.     Mouth/Throat:     Mouth: Mucous membranes are moist.     Pharynx: Oropharynx is clear.  Eyes:     General:        Right eye: No discharge.        Left eye: No discharge.     Extraocular Movements: Extraocular movements intact.     Conjunctiva/sclera: Conjunctivae normal.     Pupils: Pupils are equal, round, and reactive to light.  Cardiovascular:     Rate and Rhythm: Normal rate and regular rhythm.     Heart sounds: No murmur heard. Pulmonary:     Effort: Pulmonary effort is normal. No respiratory distress.  Breath sounds: Normal breath sounds. No wheezing or rales.  Musculoskeletal:     Cervical back: Normal range of motion and neck supple.  Skin:    General: Skin is warm and dry.     Capillary Refill: Capillary refill takes less than 2 seconds.  Neurological:     General: No focal deficit present.     Mental Status: She is alert and oriented to person, place, and time. Mental status is at baseline.  Psychiatric:        Mood and Affect: Mood normal.        Behavior: Behavior normal.        Thought Content: Thought content normal.        Judgment: Judgment normal.     Results for orders placed or performed in visit on 12/13/21  CBC with Differential/Platelet  Result Value Ref Range   WBC 8.0 3.4 - 10.8 x10E3/uL   RBC 4.23 3.77 - 5.28 x10E6/uL   Hemoglobin 13.2 11.1 - 15.9 g/dL   Hematocrit 39.4 34.0 - 46.6 %   MCV 93 79 - 97 fL   MCH 31.2 26.6 - 33.0 pg   MCHC  33.5 31.5 - 35.7 g/dL   RDW 13.8 11.7 - 15.4 %   Platelets 203 150 - 450 x10E3/uL   Neutrophils 46 Not Estab. %   Lymphs 42 Not Estab. %   Monocytes 7 Not Estab. %   Eos 4 Not Estab. %   Basos 1 Not Estab. %   Neutrophils Absolute 3.7 1.4 - 7.0 x10E3/uL   Lymphocytes Absolute 3.4 (H) 0.7 - 3.1 x10E3/uL   Monocytes Absolute 0.6 0.1 - 0.9 x10E3/uL   EOS (ABSOLUTE) 0.3 0.0 - 0.4 x10E3/uL   Basophils Absolute 0.0 0.0 - 0.2 x10E3/uL   Immature Granulocytes 0 Not Estab. %   Immature Grans (Abs) 0.0 0.0 - 0.1 x10E3/uL  Comprehensive metabolic panel  Result Value Ref Range   Glucose 90 70 - 99 mg/dL   BUN 14 6 - 24 mg/dL   Creatinine, Ser 1.03 (H) 0.57 - 1.00 mg/dL   eGFR 63 >59 mL/min/1.73   BUN/Creatinine Ratio 14 9 - 23   Sodium 140 134 - 144 mmol/L   Potassium 4.4 3.5 - 5.2 mmol/L   Chloride 103 96 - 106 mmol/L   CO2 23 20 - 29 mmol/L   Calcium 9.9 8.7 - 10.2 mg/dL   Total Protein 7.2 6.0 - 8.5 g/dL   Albumin 4.2 3.8 - 4.9 g/dL   Globulin, Total 3.0 1.5 - 4.5 g/dL   Albumin/Globulin Ratio 1.4 1.2 - 2.2   Bilirubin Total 0.2 0.0 - 1.2 mg/dL   Alkaline Phosphatase 78 44 - 121 IU/L   AST 17 0 - 40 IU/L   ALT 17 0 - 32 IU/L  TSH  Result Value Ref Range   TSH 1.380 0.450 - 4.500 uIU/mL  VITAMIN D 25 Hydroxy (Vit-D Deficiency, Fractures)  Result Value Ref Range   Vit D, 25-Hydroxy 30.4 30.0 - 100.0 ng/mL  B12  Result Value Ref Range   Vitamin B-12 569 232 - 1,245 pg/mL      Assessment & Plan:   Problem List Items Addressed This Visit       Musculoskeletal and Integument   Discoid lupus    Chronic.  Controlled.  Continue with current medication regimen of Plaquenil.  Labs ordered today.  Return to clinic in 6 months for reevaluation.  Call sooner if concerns arise.  Relevant Orders   CBC w/Diff   Rheumatoid arthritis, involving unspecified site, unspecified whether rheumatoid factor present (HCC)    Chronic.  Controlled.  Continue with current medication regimen  of Plaquenil.  Labs ordered today.  Return to clinic in 6 months for reevaluation.  Call sooner if concerns arise.          Other   Class 3 severe obesity due to excess calories without serious comorbidity with body mass index (BMI) of 40.0 to 44.9 in adult [facility])    Chronic. Continues on her weight loss journey.  She has lost 2 pants sizes since starting on wegovy.  Denies side effects from medications.  Would like to continue with medication at this time.  Labs ordered today. Follow up in 6 months.  Call sooner if concerns arise.       Relevant Medications   Semaglutide-Weight Management (WEGOVY) 2.4 MG/0.75ML SOAJ   Hyperlipemia    Labs ordered at visit today.  Will make recommendations based on lab results.        Relevant Orders   Comp Met (CMET)   Lipid Profile   Prediabetes    Labs ordered at visit today.  Will make recommendations based on lab results.        Relevant Orders   Comp Met (CMET)   HgB A1c   Vitamin D deficiency - Primary    Labs ordered at visit today.  Will make recommendations based on lab results.        Relevant Orders   Vitamin D (25 hydroxy)     Follow up plan: Return in about 6 months (around 09/18/2022) for HTN, HLD, DM2 FU.

## 2022-03-20 NOTE — Assessment & Plan Note (Signed)
Chronic. Continues on her weight loss journey.  She has lost 2 pants sizes since starting on wegovy.  Denies side effects from medications.  Would like to continue with medication at this time.  Labs ordered today. Follow up in 6 months.  Call sooner if concerns arise.

## 2022-03-21 LAB — CBC WITH DIFFERENTIAL/PLATELET
Basophils Absolute: 0 10*3/uL (ref 0.0–0.2)
Basos: 1 %
EOS (ABSOLUTE): 0.3 10*3/uL (ref 0.0–0.4)
Eos: 6 %
Hematocrit: 40.2 % (ref 34.0–46.6)
Hemoglobin: 13.8 g/dL (ref 11.1–15.9)
Immature Grans (Abs): 0 10*3/uL (ref 0.0–0.1)
Immature Granulocytes: 0 %
Lymphocytes Absolute: 2.2 10*3/uL (ref 0.7–3.1)
Lymphs: 42 %
MCH: 32.3 pg (ref 26.6–33.0)
MCHC: 34.3 g/dL (ref 31.5–35.7)
MCV: 94 fL (ref 79–97)
Monocytes Absolute: 0.3 10*3/uL (ref 0.1–0.9)
Monocytes: 6 %
Neutrophils Absolute: 2.4 10*3/uL (ref 1.4–7.0)
Neutrophils: 45 %
Platelets: 207 10*3/uL (ref 150–450)
RBC: 4.27 x10E6/uL (ref 3.77–5.28)
RDW: 13.2 % (ref 11.7–15.4)
WBC: 5.3 10*3/uL (ref 3.4–10.8)

## 2022-03-21 LAB — HEMOGLOBIN A1C
Est. average glucose Bld gHb Est-mCnc: 108 mg/dL
Hgb A1c MFr Bld: 5.4 % (ref 4.8–5.6)

## 2022-03-21 LAB — COMPREHENSIVE METABOLIC PANEL
ALT: 18 IU/L (ref 0–32)
AST: 25 IU/L (ref 0–40)
Albumin/Globulin Ratio: 1.4 (ref 1.2–2.2)
Albumin: 4.3 g/dL (ref 3.8–4.9)
Alkaline Phosphatase: 76 IU/L (ref 44–121)
BUN/Creatinine Ratio: 14 (ref 9–23)
BUN: 11 mg/dL (ref 6–24)
Bilirubin Total: 0.3 mg/dL (ref 0.0–1.2)
CO2: 21 mmol/L (ref 20–29)
Calcium: 9.9 mg/dL (ref 8.7–10.2)
Chloride: 106 mmol/L (ref 96–106)
Creatinine, Ser: 0.78 mg/dL (ref 0.57–1.00)
Globulin, Total: 3 g/dL (ref 1.5–4.5)
Glucose: 78 mg/dL (ref 70–99)
Potassium: 4.2 mmol/L (ref 3.5–5.2)
Sodium: 143 mmol/L (ref 134–144)
Total Protein: 7.3 g/dL (ref 6.0–8.5)
eGFR: 87 mL/min/{1.73_m2} (ref >59)

## 2022-03-21 LAB — LIPID PANEL
Chol/HDL Ratio: 2.8 ratio (ref 0.0–4.4)
Cholesterol, Total: 189 mg/dL (ref 100–199)
HDL: 67 mg/dL (ref >39)
LDL Chol Calc (NIH): 111 mg/dL — ABNORMAL HIGH (ref 0–99)
Triglycerides: 56 mg/dL (ref 0–149)
VLDL Cholesterol Cal: 11 mg/dL (ref 5–40)

## 2022-03-21 LAB — VITAMIN D 25 HYDROXY (VIT D DEFICIENCY, FRACTURES): Vit D, 25-Hydroxy: 77.7 ng/mL (ref 30.0–100.0)

## 2022-03-21 NOTE — Progress Notes (Signed)
Hi Stacey Marshall. It was nice to meet you yesterday.  Your lab work looks good.  Your cholesterol has improved from prior.  A1c is in the normal range at 5.4%.  No concerns at this time. Continue with your current medication regimen.  Follow up as discussed.  Please let me know if you have any questions.

## 2022-04-10 ENCOUNTER — Telehealth: Payer: Self-pay | Admitting: Nurse Practitioner

## 2022-04-10 NOTE — Telephone Encounter (Signed)
Copied from [place] [phone]. Topic: General - Other >> Apr 10, 2022 10:48 AM Stacey Marshall wrote: Reason for CRM: The patient has called to request completion of prior authorization for their Semaglutide-Weight Management (WEGOVY) 2.4 MG/0.75ML Darden Palmer [id number]  Prior Auth Code [id number]  Please contact the patient further when possible

## 2022-04-10 NOTE — Telephone Encounter (Signed)
PA for [facility] initiated and submitted via Cover My Meds. Key: [id number]

## 2022-04-11 NOTE — Telephone Encounter (Signed)
PA approved. Called and notified patient of approval.

## 2022-04-14 ENCOUNTER — Telehealth: Payer: Self-pay | Admitting: Nurse Practitioner

## 2022-04-14 NOTE — Telephone Encounter (Signed)
Copied from [place] [id number]. Topic: General - Inquiry >> Apr 14, 2022  9:24 AM Penni Bombard wrote: Reason for CRM: pt called wanted to make sure that after this month of the [facility] that Torri will send in the refills for the [facility].  She said this last prescription has Cheryl's name on it.  CB#  [phone]

## 2022-04-14 NOTE — Telephone Encounter (Signed)
Called and notified patient that the most recent script was sent in by Santiago Glad.

## 2022-04-24 ENCOUNTER — Ambulatory Visit: Payer: Managed Care, Other (non HMO) | Admitting: Obstetrics and Gynecology

## 2022-05-31 ENCOUNTER — Ambulatory Visit (INDEPENDENT_AMBULATORY_CARE_PROVIDER_SITE_OTHER): Payer: Managed Care, Other (non HMO) | Admitting: Obstetrics and Gynecology

## 2022-05-31 ENCOUNTER — Encounter: Payer: Self-pay | Admitting: Obstetrics and Gynecology

## 2022-05-31 VITALS — BP 124/79 | HR 85 | Resp 16 | Ht 61.5 in | Wt 192.7 lb

## 2022-05-31 DIAGNOSIS — E669 Obesity, unspecified: Secondary | ICD-10-CM

## 2022-05-31 DIAGNOSIS — E7800 Pure hypercholesterolemia, unspecified: Secondary | ICD-10-CM

## 2022-05-31 DIAGNOSIS — R7303 Prediabetes: Secondary | ICD-10-CM

## 2022-05-31 DIAGNOSIS — Z1231 Encounter for screening mammogram for malignant neoplasm of breast: Secondary | ICD-10-CM

## 2022-05-31 DIAGNOSIS — N951 Menopausal and female climacteric states: Secondary | ICD-10-CM

## 2022-05-31 DIAGNOSIS — Z01419 Encounter for gynecological examination (general) (routine) without abnormal findings: Secondary | ICD-10-CM | POA: Diagnosis not present

## 2022-05-31 DIAGNOSIS — R87810 Cervical high risk human papillomavirus (HPV) DNA test positive: Secondary | ICD-10-CM

## 2022-05-31 NOTE — Progress Notes (Signed)
ANNUAL PREVENTATIVE CARE GYNECOLOGY  ENCOUNTER NOTE  Subjective:       Stacey Marshall is a 62 y.o. G0P0000 female here for a routine annual gynecologic exam. Previously seen by Dr. Vena Austria in 2022 of [facility]. The patient is sexually active. The patient is taking hormone replacement therapy. Patient denies post-menopausal vaginal bleeding. The patient wears seatbelts: yes. The patient participates in regular exercise: no. Has the patient ever been transfused or tattooed?: no. The patient reports that there is not domestic violence in her life.  Current complaints: 1.  Reports some mild discomfort with sex due to vaginal dryness, but is using estrogen cream as prescribed for atrophy.  2.  Has a history of abnormal pap smears over the past several years.s  Wonders if this is ever going to clear up.   3.  Reports weight loss on Wegovy, almost 30 lbs in the past 6 months.    Gynecologic History No LMP recorded. Patient is perimenopausal. Contraception: post menopausal status Last Pap: 09/06/2020. Results were: abnormal, NILM HR HPV+. Has history of abnormal pap smears over the past 4 years, (NILM, HR HPV+, cotesting positive for type 18 in 2018).  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  Last mammogram: 09/12/2021. Results were: normal Last Colonoscopy: 08/19/2018: 10 years Last Dexa Scan:    Obstetric History OB History  Gravida Para Term Preterm AB Living  0 0 0 0 0 0  SAB IAB Ectopic Multiple Live Births  0 0 0 0 0    Past Medical History:  Diagnosis Date   Back pain    Discoid lupus    Joint pain    Obesity    Rheumatoid arthritis (HCC)     Family History  Problem Relation Age of Onset   Diabetes Mother    Hypertension Mother    AAA (abdominal aortic aneurysm) Mother    Diabetes Father    Hypertension Father    Hypertension Sister    Diabetes Sister    Hypertension Sister    Hypertension Brother    Hyperlipidemia Brother    Diabetes  Brother     Past Surgical History:  Procedure Laterality Date   COLONOSCOPY WITH PROPOFOL N/A 08/19/2018   Procedure: COLONOSCOPY WITH PROPOFOL;  Surgeon: Toney Reil, MD;  Location: [facility] ENDOSCOPY;  Service: Gastroenterology;  Laterality: N/A;   MOUTH SURGERY      Social History   Socioeconomic History   Marital status: Single    Spouse name: Not on file   Number of children: Not on file   Years of education: Not on file   Highest education level: Not on file  Occupational History   Occupation: [employer]  Tobacco Use   Smoking status: Never   Smokeless tobacco: Never  Vaping Use   Vaping Use: Never used  Substance and Sexual Activity   Alcohol use: Yes    Alcohol/week: 2.0 - 3.0 standard drinks of alcohol    Types: 2 - 3 Glasses of wine per week   Drug use: No   Sexual activity: Not Currently    Birth control/protection: None  Other Topics Concern   Not on file  Social History Narrative   Not on file   Social Determinants of Health   Financial Resource Strain: Not on file  Food Insecurity: Not on file  Transportation Needs: Not on file  Physical Activity: Not on file  Stress: Not on file  Social Connections: Not on file  Intimate Partner Violence:  Not on file    Current Outpatient Medications on File Prior to Visit  Medication Sig Dispense Refill   Cholecalciferol 25 MCG (1000 UT) tablet Take 1,000 Units by mouth daily.     estradiol (ESTRACE) 0.1 MG/GM vaginal cream Place 1 Applicatorful vaginally 3 (three) times a week. 42.5 g 3   fluticasone (FLONASE) 50 MCG/ACT nasal spray Place 2 sprays into both nostrils daily. 16 g 6   hydroxychloroquine (PLAQUENIL) 200 MG tablet Take 200 mg by mouth 2 (two) times daily.      Semaglutide-Weight Management (WEGOVY) 2.4 MG/0.75ML SOAJ Inject 2.4 mg into the skin once a week. 6 mL 1   No current facility-administered medications on file prior to visit.    Allergies  Allergen Reactions   Bactrim  [Sulfamethoxazole-Trimethoprim] Hives   Saxenda [Liraglutide -Weight Management] Rash      Review of Systems ROS Review of Systems - General ROS: negative for - chills, fatigue, fever, hot flashes, night sweats, weight gain or weight loss Psychological ROS: negative for - anxiety, decreased libido, depression, mood swings, physical abuse or sexual abuse Ophthalmic ROS: negative for - blurry vision, eye pain or loss of vision ENT ROS: negative for - headaches, hearing change, visual changes or vocal changes Allergy and Immunology ROS: negative for - hives, itchy/watery eyes or seasonal allergies Hematological and Lymphatic ROS: negative for - bleeding problems, bruising, swollen lymph nodes or weight loss Endocrine ROS: negative for - galactorrhea, hair pattern changes, hot flashes, malaise/lethargy, mood swings, palpitations, polydipsia/polyuria, skin changes, temperature intolerance or unexpected weight changes Breast ROS: negative for - new or changing breast lumps or nipple discharge Respiratory ROS: negative for - cough or shortness of breath Cardiovascular ROS: negative for - chest pain, irregular heartbeat, palpitations or shortness of breath Gastrointestinal ROS: no abdominal pain, change in bowel habits, or black or bloody stools Genito-Urinary ROS: no dysuria, trouble voiding, or hematuria. Mild dyspareunia present.  Musculoskeletal ROS: negative for - joint pain or joint stiffness Neurological ROS: negative for - bowel and bladder control changes Dermatological ROS: negative for rash and skin lesion changes   Objective:   BP 124/79   Pulse 85   Resp 16   Ht 5' 1.5" (1.562 m)   Wt 192 lb 11.2 oz (87.4 kg)   BMI 35.82 kg/m  CONSTITUTIONAL: Well-developed, well-nourished female in no acute distress.  PSYCHIATRIC: Normal mood and affect. Normal behavior. Normal judgment and thought content. NEUROLGIC: Alert and oriented to person, place, and time. Normal muscle tone  coordination. No cranial nerve deficit noted. HENT:  Normocephalic, atraumatic, External right and left ear normal. Oropharynx is clear and moist EYES: Conjunctivae and EOM are normal. Pupils are equal, round, and reactive to light. No scleral icterus.  NECK: Normal range of motion, supple, no masses.  Normal thyroid.  SKIN: Skin is warm and dry. No rash noted. Not diaphoretic. No erythema. No pallor. CARDIOVASCULAR: Normal heart rate noted, regular rhythm, no murmur. RESPIRATORY: Clear to auscultation bilaterally. Effort and breath sounds normal, no problems with respiration noted. BREASTS: Symmetric in size. No masses, skin changes, nipple drainage, or lymphadenopathy. ABDOMEN: Soft, normal bowel sounds, no distention noted.  No tenderness, rebound or guarding.  BLADDER: Normal PELVIC:  Bladder no bladder distension noted  Urethra: normal appearing urethra with no masses, tenderness or lesions  Vulva: normal appearing vulva with no masses, tenderness or lesions  Vagina: atrophic vagina without discharge or lesions   Cervix: mildly atrophic cervix without discharge or lesions  Uterus: uterus  is normal size, shape, consistency and nontender  Adnexa: normal adnexa in size, nontender and no masses  RV: External Exam NormaI, No Rectal Masses, and Normal Sphincter tone  MUSCULOSKELETAL: Normal range of motion. No tenderness.  No cyanosis, clubbing, or edema.  2+ distal pulses. LYMPHATIC: No Axillary, Supraclavicular, or Inguinal Adenopathy.   Labs: Lab Results  Component Value Date   WBC 5.3 03/20/2022   HGB 13.8 03/20/2022   HCT 40.2 03/20/2022   MCV 94 03/20/2022   PLT 207 03/20/2022    Lab Results  Component Value Date   CREATININE 0.78 03/20/2022   BUN 11 03/20/2022   NA 143 03/20/2022   K 4.2 03/20/2022   CL 106 03/20/2022   CO2 21 03/20/2022    Lab Results  Component Value Date   ALT 18 03/20/2022   AST 25 03/20/2022   ALKPHOS 76 03/20/2022   BILITOT 0.3 03/20/2022     Lab Results  Component Value Date   CHOL 189 03/20/2022   HDL 67 03/20/2022   LDLCALC 111 (H) 03/20/2022   TRIG 56 03/20/2022   CHOLHDL 2.8 03/20/2022    Lab Results  Component Value Date   TSH 1.380 12/13/2021    Lab Results  Component Value Date   HGBA1C 5.4 03/20/2022     Assessment:   1. Well woman exam with routine gynecological exam   2. Breast cancer screening by mammogram   3. Cervical high risk HPV (human papillomavirus) test positive   4. Vaginal dryness, menopausal   5. Obesity (BMI 30-39.9)   6. Prediabetes   7. Pure hypercholesterolemia      Plan:  - Pap: Pap Co Test. Discussed HPV, likelihood of remission over time.  - Mammogram: Ordered - Colon Screening:   UTD - Labs:  UTD - Routine preventative health maintenance measures emphasized: Exercise/Diet/Weight control and Stress Management and Self Breast Exam - Obesity, pre-diabetes and cholesterol managed by PCP. Doing well on Wegovy.  - Vaginal dryness, menopausal. Using estrogen cream. Advised that she can increase dosing or frequency if needed, if still symptomatic.  - Return to Clinic - 1 Year   Hildred Laser, MD [facility]

## 2022-05-31 NOTE — Patient Instructions (Signed)
Preventive Care 60-60 Years Old, Female Preventive care refers to lifestyle choices and visits with your health care provider that can promote health and wellness. Preventive care visits are also called wellness exams. What can I expect for my preventive care visit? Counseling Your health care provider may ask you questions about your: Medical history, including: Past medical problems. Family medical history. Pregnancy history. Current health, including: Menstrual cycle. Method of birth control. Emotional well-being. Home life and relationship well-being. Sexual activity and sexual health. Lifestyle, including: Alcohol, nicotine or tobacco, and drug use. Access to firearms. Diet, exercise, and sleep habits. Work and work environment. Sunscreen use. Safety issues such as seatbelt and bike helmet use. Physical exam Your health care provider will check your: Height and weight. These may be used to calculate your BMI (body mass index). BMI is a measurement that tells if you are at a healthy weight. Waist circumference. This measures the distance around your waistline. This measurement also tells if you are at a healthy weight and may help predict your risk of certain diseases, such as type 2 diabetes and high blood pressure. Heart rate and blood pressure. Body temperature. Skin for abnormal spots. What immunizations do I need?  Vaccines are usually given at various ages, according to a schedule. Your health care provider will recommend vaccines for you based on your age, medical history, and lifestyle or other factors, such as travel or where you work. What tests do I need? Screening Your health care provider may recommend screening tests for certain conditions. This may include: Lipid and cholesterol levels. Diabetes screening. This is done by checking your blood sugar (glucose) after you have not eaten for a while (fasting). Pelvic exam and Pap test. Hepatitis B test. Hepatitis C  test. HIV (human immunodeficiency virus) test. STI (sexually transmitted infection) testing, if you are at risk. Lung cancer screening. Colorectal cancer screening. Mammogram. Talk with your health care provider about when you should start having regular mammograms. This may depend on whether you have a family history of breast cancer. BRCA-related cancer screening. This may be done if you have a family history of breast, ovarian, tubal, or peritoneal cancers. Bone density scan. This is done to screen for osteoporosis. Talk with your health care provider about your test results, treatment options, and if necessary, the need for more tests. Follow these instructions at home: Eating and drinking  Eat a diet that includes fresh fruits and vegetables, whole grains, lean protein, and low-fat dairy products. Take vitamin and mineral supplements as recommended by your health care provider. Do not drink alcohol if: Your health care provider tells you not to drink. You are pregnant, may be pregnant, or are planning to become pregnant. If you drink alcohol: Limit how much you have to 0-1 drink a day. Know how much alcohol is in your drink. In the [place] one drink equals one 12 oz bottle of beer (355 mL), one 5 oz glass of wine (148 mL), or one 1 oz glass of hard liquor (44 mL). Lifestyle Brush your teeth every morning and night with fluoride toothpaste. Floss one time each day. Exercise for at least 30 minutes 5 or more days each week. Do not use any products that contain nicotine or tobacco. These products include cigarettes, chewing tobacco, and vaping devices, such as e-cigarettes. If you need help quitting, ask your health care provider. Do not use drugs. If you are sexually active, practice safe sex. Use a condom or other form of protection to   prevent STIs. If you do not wish to become pregnant, use a form of birth control. If you plan to become pregnant, see your health care provider for a  prepregnancy visit. Take aspirin only as told by your health care provider. Make sure that you understand how much to take and what form to take. Work with your health care provider to find out whether it is safe and beneficial for you to take aspirin daily. Find healthy ways to manage stress, such as: Meditation, yoga, or listening to music. Journaling. Talking to a trusted person. Spending time with friends and family. Minimize exposure to UV radiation to reduce your risk of skin cancer. Safety Always wear your seat belt while driving or riding in a vehicle. Do not drive: If you have been drinking alcohol. Do not ride with someone who has been drinking. When you are tired or distracted. While texting. If you have been using any mind-altering substances or drugs. Wear a helmet and other protective equipment during sports activities. If you have firearms in your house, make sure you follow all gun safety procedures. Seek help if you have been physically or sexually abused. What's next? Visit your health care provider once a year for an annual wellness visit. Ask your health care provider how often you should have your eyes and teeth checked. Stay up to date on all vaccines. This information is not intended to replace advice given to you by your health care provider. Make sure you discuss any questions you have with your health care provider. Document Revised: 07/21/2020 Document Reviewed: 07/21/2020 Elsevier Patient Education  2023 Elsevier Inc. Breast Self-Awareness Breast self-awareness is knowing how your breasts look and feel. You need to: Check your breasts on a regular basis. Tell your doctor about any changes. Become familiar with the look and feel of your breasts. This can help you catch a breast problem while it is still small and can be treated. You should do breast self-exams even if you have breast implants. What you need: A mirror. A well-lit room. A pillow or other  soft object. How to do a breast self-exam Follow these steps to do a breast self-exam: Look for changes  Take off all the clothes above your waist. Stand in front of a mirror in a room with good lighting. Put your hands down at your sides. Compare your breasts in the mirror. Look for any difference between them, such as: A difference in shape. A difference in size. Wrinkles, dips, and bumps in one breast and not the other. Look at each breast for changes in the skin, such as: Redness. Scaly areas. Skin that has gotten thicker. Dimpling. Open sores (ulcers). Look for changes in your nipples, such as: Fluid coming out of a nipple. Fluid around a nipple. Bleeding. Dimpling. Redness. A nipple that looks pushed in (retracted), or that has changed position. Feel for changes Lie on your back. Feel each breast. To do this: Pick a breast to feel. Place a pillow under the shoulder closest to that breast. Put the arm closest to that breast behind your head. Feel the nipple area of that breast using the hand of your other arm. Feel the area with the pads of your three middle fingers by making small circles with your fingers. Use light, medium, and firm pressure. Continue the overlapping circles, moving downward over the breast. Keep making circles with your fingers. Stop when you feel your ribs. Start making circles with your fingers again, this time going   upward until you reach your collarbone. Then, make circles outward across your breast and into your armpit area. Squeeze your nipple. Check for discharge and lumps. Repeat these steps to check your other breast. Sit or stand in the tub or shower. With soapy water on your skin, feel each breast the same way you did when you were lying down. Write down what you find Writing down what you find can help you remember what to tell your doctor. Write down: What is normal for each breast. Any changes you find in each breast. These  include: The kind of changes you find. A tender or painful breast. Any lump you find. Write down its size and where it is. When you last had your monthly period (menstrual cycle). General tips If you are breastfeeding, the best time to check your breasts is after you feed your baby or after you use a breast pump. If you get monthly bleeding, the best time to check your breasts is 5-7 days after your monthly cycle ends. With time, you will become comfortable with the self-exam. You will also start to know if there are changes in your breasts. Contact a doctor if: You see a change in the shape or size of your breasts or nipples. You see a change in the skin of your breast or nipples, such as red or scaly skin. You have fluid coming from your nipples that is not normal. You find a new lump or thick area. You have breast pain. You have any concerns about your breast health. Summary Breast self-awareness includes looking for changes in your breasts and feeling for changes within your breasts. You should do breast self-awareness in front of a mirror in a well-lit room. If you get monthly periods (menstrual cycles), the best time to check your breasts is 5-7 days after your period ends. Tell your doctor about any changes you see in your breasts. Changes include changes in size, changes on the skin, painful or tender breasts, or fluid from your nipples that is not normal. This information is not intended to replace advice given to you by your health care provider. Make sure you discuss any questions you have with your health care provider. Document Revised: 06/30/2021 Document Reviewed: 11/25/2020 Elsevier Patient Education  2023 Elsevier Inc.  

## 2022-06-01 NOTE — Addendum Note (Signed)
Addended by: Tommie Raymond on: 06/01/2022 04:46 PM   Modules accepted: Orders

## 2022-06-07 LAB — IGP, COBASHPV16/18
HPV 16: NEGATIVE
HPV 18: NEGATIVE
HPV other hr types: POSITIVE — AB

## 2022-06-09 ENCOUNTER — Telehealth: Payer: Self-pay

## 2022-06-09 NOTE — Telephone Encounter (Signed)
Contacted patient regarding concerns about recent results.  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 Based on this, she no longer requires any procedures at this time and can be monitored. Discussed role of HPV in cervical dysplasia and risk of cancer based on particular types of HPV.  If another pap smear returns with high risk types, then at that time can proceed with further procedures such as colposcopy or LEEP if indicated. Patient notes understanding.     Hildred Laser, MD [facility]

## 2022-08-22 ENCOUNTER — Other Ambulatory Visit: Payer: Self-pay | Admitting: Nurse Practitioner

## 2022-08-22 NOTE — Telephone Encounter (Signed)
Medication Refill - Medication: Semaglutide-Weight Management (WEGOVY) 2.4 MG/0.75ML SOAJ   Has the patient contacted their pharmacy? No. ( Preferred Pharmacy (with phone number or street name): [facility] [address] [facility] - [place], [place] - [address]  Has the patient been seen for an appointment in the last year OR does the patient have an upcoming appointment? Yes.    Agent: Please be advised that RX refills may take up to 3 business days. We ask that you follow-up with your pharmacy.

## 2022-08-23 ENCOUNTER — Other Ambulatory Visit: Payer: Self-pay | Admitting: Nurse Practitioner

## 2022-08-23 MED ORDER — WEGOVY 2.4 MG/0.75ML ~~LOC~~ SOAJ: 2.4000 mg | SUBCUTANEOUS | 1 refills | Status: DC

## 2022-08-23 NOTE — Telephone Encounter (Signed)
Unable to refill per protocol, Rx request was refilled 08/23/22, duplicate request.  Requested Prescriptions  Pending Prescriptions Disp Refills   WEGOVY 2.4 MG/0.75ML SOAJ [Pharmacy Med Name: WEGOVY 2.4 MG/0.75 ML PEN[R]] 6 mL 1    Sig: INJECT THE CONTENTS OF ONE PEN UNDER THE SKIN ONCE WEEKLY ON THE SAME DAY EACH WEEK     Endocrinology:  Diabetes - GLP-1 Receptor Agonists - semaglutide Passed - 08/23/2022 10:01 AM      Passed - HBA1C in normal range and within 180 days    HB A1C (BAYER DCA - WAIVED)  Date Value Ref Range Status  08/06/2020 5.4 <7.0 % Final    Comment:                                          Diabetic Adult            <7.0                                       Healthy Adult        4.3 - 5.7                                                           (DCCT/NGSP) American Diabetes Association's Summary of Glycemic Recommendations for Adults with Diabetes: Hemoglobin A1c <7.0%. More stringent glycemic goals (A1c <6.0%) may further reduce complications at the cost of increased risk of hypoglycemia.    Hgb A1c MFr Bld  Date Value Ref Range Status  03/20/2022 5.4 4.8 - 5.6 % Final    Comment:             Prediabetes: 5.7 - 6.4          Diabetes: >6.4          Glycemic control for adults with diabetes: <7.0          Passed - Cr in normal range and within 360 days    Creatinine, Ser  Date Value Ref Range Status  03/20/2022 0.78 0.57 - 1.00 mg/dL Final         Passed - Valid encounter within last 6 months    Recent Outpatient Visits           5 months ago Vitamin D deficiency   [place] [facility] Larae Grooms, NP   7 months ago Class 2 obesity due to excess calories without serious comorbidity with body mass index (BMI) of 38.0 to 38.9 in adult   [facility] Gabriel Cirri, NP   8 months ago Class 2 obesity due to excess calories without serious comorbidity with body mass index (BMI) of 39.0 to 39.9 in adult   [facility] Gabriel Cirri, NP   1 year ago Routine medical exam   [facility] Gabriel Cirri, NP   1 year ago Vaginal dryness, menopausal   [facility] McElwee, Jake Church, NP       Future Appointments             In 1 month Mecum,  Mirian Mo [place] [facility], [facility]

## 2022-08-23 NOTE — Telephone Encounter (Signed)
Requested Prescriptions  Pending Prescriptions Disp Refills   Semaglutide-Weight Management (WEGOVY) 2.4 MG/0.75ML SOAJ 6 mL 1    Sig: Inject 2.4 mg into the skin once a week.     Endocrinology:  Diabetes - GLP-1 Receptor Agonists - semaglutide Passed - 08/22/2022 11:38 AM      Passed - HBA1C in normal range and within 180 days    HB A1C (BAYER DCA - WAIVED)  Date Value Ref Range Status  08/06/2020 5.4 <7.0 % Final    Comment:                                          Diabetic Adult            <7.0                                       Healthy Adult        4.3 - 5.7                                                           (DCCT/NGSP) American Diabetes Association's Summary of Glycemic Recommendations for Adults with Diabetes: Hemoglobin A1c <7.0%. More stringent glycemic goals (A1c <6.0%) may further reduce complications at the cost of increased risk of hypoglycemia.    Hgb A1c MFr Bld  Date Value Ref Range Status  03/20/2022 5.4 4.8 - 5.6 % Final    Comment:             Prediabetes: 5.7 - 6.4          Diabetes: >6.4          Glycemic control for adults with diabetes: <7.0          Passed - Cr in normal range and within 360 days    Creatinine, Ser  Date Value Ref Range Status  03/20/2022 0.78 0.57 - 1.00 mg/dL Final         Passed - Valid encounter within last 6 months    Recent Outpatient Visits           5 months ago Vitamin D deficiency   [facility] Larae Grooms, NP   7 months ago Class 2 obesity due to excess calories without serious comorbidity with body mass index (BMI) of 38.0 to 38.9 in adult   [facility] Gabriel Cirri, NP   8 months ago Class 2 obesity due to excess calories without serious comorbidity with body mass index (BMI) of 39.0 to 39.9 in adult   [facility] Gabriel Cirri, NP   1 year ago Routine medical exam   [facility] Gabriel Cirri, NP    1 year ago Vaginal dryness, menopausal   [place] [facility] McElwee, Jake Church, NP       Future Appointments             In 1 month Mecum, Oswaldo Conroy, PA-C [facility], [facility]

## 2022-09-18 LAB — HM MAMMOGRAPHY

## 2022-09-25 ENCOUNTER — Ambulatory Visit: Payer: Managed Care, Other (non HMO) | Admitting: Physician Assistant

## 2022-09-25 VITALS — BP 116/82 | HR 80 | Ht 61.5 in | Wt 186.4 lb

## 2022-09-25 DIAGNOSIS — E559 Vitamin D deficiency, unspecified: Secondary | ICD-10-CM

## 2022-09-25 DIAGNOSIS — R7303 Prediabetes: Secondary | ICD-10-CM

## 2022-09-25 DIAGNOSIS — M069 Rheumatoid arthritis, unspecified: Secondary | ICD-10-CM | POA: Diagnosis not present

## 2022-09-25 DIAGNOSIS — E7800 Pure hypercholesterolemia, unspecified: Secondary | ICD-10-CM | POA: Diagnosis not present

## 2022-09-25 DIAGNOSIS — Z6841 Body Mass Index (BMI) 40.0 and over, adult: Secondary | ICD-10-CM

## 2022-09-25 MED ORDER — WEGOVY 2.4 MG/0.75ML ~~LOC~~ SOAJ
2.4000 mg | SUBCUTANEOUS | 2 refills | Status: DC
Start: 2022-09-25 — End: 2023-04-30

## 2022-09-25 NOTE — Progress Notes (Signed)
Established Patient Office Visit  Name: Stacey Marshall   MRN: [id number]    DOB: [birth year]   Date:09/25/2022  Today's Provider: Jacquelin Hawking, MHS, PA-C Introduced myself to the patient as a PA-C and provided education on APPs in clinical practice.         Subjective  Chief Complaint  Chief Complaint  Patient presents with   Obesity    Patient says she has been noticing a stand still with the [facility] prescription. Patient says she is staying between 180-185-lbs. Patient says she feels she is losing inches, but the scales isn't changing.    Rheumatoid Arthritis    HPI  Rheumatoid Arthritis/ lupus Was last seen by Rheumatology in Mar    Obesity She has been taking [place] weekly  She reports she feels like she is stuck at her current weight- she does not think she is progressing with her weight loss at this point She is also concerned for how little she is eating- she is trying to make sure she is getting extra protein but reports she is typically full after just a few bites She states her clothing still feels looser and looser so she thinks she is losing more inches vs lbs She reports she is riding her bike several times per week but not as much as she should     Patient Active Problem List   Diagnosis Date Noted   Rheumatoid arthritis, involving unspecified site, unspecified whether rheumatoid factor present (HCC) 03/20/2022   Vaginal dryness, menopausal 10/26/2020   Prediabetes 11/27/2019   Vitamin D deficiency 11/27/2019   Hyperlipemia 06/23/2019   Allergic rhinitis 07/23/2018   Class 3 severe obesity due to excess calories without serious comorbidity with body mass index (BMI) of 40.0 to 44.9 in adult (HCC) 07/19/2018   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    Past Surgical History:  Procedure Laterality Date   COLONOSCOPY WITH PROPOFOL N/A 08/19/2018   Procedure: COLONOSCOPY WITH PROPOFOL;  Surgeon: Toney Reil, MD;  Location: [facility] ENDOSCOPY;  Service:  Gastroenterology;  Laterality: N/A;   MOUTH SURGERY      Family History  Problem Relation Age of Onset   Diabetes Mother    Hypertension Mother    AAA (abdominal aortic aneurysm) Mother    Diabetes Father    Hypertension Father    Hypertension Sister    Diabetes Sister    Hypertension Sister    Hypertension Brother    Hyperlipidemia Brother    Diabetes Brother     Social History   Tobacco Use   Smoking status: Never   Smokeless tobacco: Never  Substance Use Topics   Alcohol use: Yes    Alcohol/week: 2.0 - 3.0 standard drinks of alcohol    Types: 2 - 3 Glasses of wine per week     Current Outpatient Medications:    Cholecalciferol 25 MCG (1000 UT) tablet, Take 1,000 Units by mouth daily., Disp: , Rfl:    estradiol (ESTRACE) 0.1 MG/GM vaginal cream, Place 1 Applicatorful vaginally 3 (three) times a week., Disp: 42.5 g, Rfl: 3   fluticasone (FLONASE) 50 MCG/ACT nasal spray, Place 2 sprays into both nostrils daily., Disp: 16 g, Rfl: 6   hydroxychloroquine (PLAQUENIL) 200 MG tablet, Take 200 mg by mouth 2 (two) times daily. , Disp: , Rfl:    Semaglutide-Weight Management (WEGOVY) 2.4 MG/0.75ML SOAJ, Inject 2.4 mg into the skin once a week., Disp: 6 mL, Rfl: 2  Allergies  Allergen  Reactions   Bactrim [Sulfamethoxazole-Trimethoprim] Hives   Saxenda [Liraglutide -Weight Management] Rash    I personally reviewed active problem list, medication list, allergies, health maintenance, notes from last encounter, lab results with the patient/caregiver today.   ROS    Objective  Vitals:   09/25/22 1014  BP: 116/82  Pulse: 80  SpO2: 98%  Weight: 186 lb 6.4 oz (84.6 kg)  Height: 5' 1.5" (1.562 m)    Body mass index is 34.65 kg/m.  Physical Exam   Recent Results (from the past 2160 hour(s))  HM MAMMOGRAPHY     Status: Normal   Collection Time: 09/18/22 12:00 AM  Result Value Ref Range   HM Mammogram 0-4 Bi-Rad 0-4 Bi-Rad, Self Reported Normal     PHQ2/9:     09/25/2022   10:20 AM 05/31/2022    8:20 AM 03/20/2022   10:42 AM 01/17/2022   10:38 AM 12/13/2021    3:06 PM  Depression screen PHQ 2/9  Decreased Interest 0 0 0 0 1  Down, Depressed, Hopeless 0 0 0 0 0  PHQ - 2 Score 0 0 0 0 1  Altered sleeping 1  1 1 3   Tired, decreased energy 0  0 1 2  Change in appetite 0  1 0 2  Feeling bad or failure about yourself  0  0 0 0  Trouble concentrating 0  0 0 0  Moving slowly or fidgety/restless 0  0 0 0  Suicidal thoughts 0  0 0 0  PHQ-9 Score 1  2 2 8   Difficult doing work/chores Not difficult at all  Not difficult at all Not difficult at all Not difficult at all      Fall Risk:    09/25/2022   10:19 AM 05/31/2022    8:20 AM 03/20/2022   10:43 AM 01/17/2022   10:38 AM 12/13/2021    3:06 PM  Fall Risk   Falls in the past year? 0 0 0 0 0  Number falls in past yr: 0 0 0 0 0  Injury with Fall? 0 0 0 0 0  Risk for fall due to : No Fall Risks No Fall Risks No Fall Risks No Fall Risks No Fall Risks  Follow up Falls evaluation completed Falls evaluation completed Falls evaluation completed Falls evaluation completed Falls evaluation completed      Functional Status Survey:      Assessment & Plan  Problem List Items Addressed This Visit       Musculoskeletal and Integument   Rheumatoid arthritis, involving unspecified site, unspecified whether rheumatoid factor present (HCC)    Chronic, historic condition She is regularly followed by Rheumatology  Defer to their recommendations- Continue with current medication regimen        Other   Class 3 severe obesity due to excess calories without serious comorbidity with body mass index (BMI) of 40.0 to 44.9 in adult [facility]) - Primary    She has lost almost 40 lbs since starting the [facility]  We reviewed increasing protein sources and vegetables - reducing carbs as tolerable We discussed doing weight and strength training to help with metabolism and further weight  We discussed other sources of  protein such as quinoa and vegetable sources Recommend she continues with current Wegovy dose for now Follow up in 6 months or sooner if concerns arise        Relevant Medications   Semaglutide-Weight Management (WEGOVY) 2.4 MG/0.75ML SOAJ   Other Relevant Orders   Comp  Met (CMET)   CBC w/Diff   Lipid Profile   HgB A1c   Hyperlipemia    Recheck cholesterol today  Results to dictate further management Encouraged diet and exercise efforts      Relevant Orders   Lipid Profile   Prediabetes    Recheck labs today  Results to dictate further management       Relevant Orders   HgB A1c   Vitamin D deficiency    Recheck levels today- results to dictate further management       Relevant Orders   Vitamin D (25 hydroxy)     No follow-ups on file.   I, Logen Heintzelman E Laverle Pillard, PA-C, have reviewed all documentation for this visit. The documentation on 09/25/22 for the exam, diagnosis, procedures, and orders are all accurate and complete.   Jacquelin Hawking, MHS, PA-C [facility]

## 2022-09-25 NOTE — Assessment & Plan Note (Signed)
Chronic, historic condition She is regularly followed by Rheumatology  Defer to their recommendations- Continue with current medication regimen

## 2022-09-25 NOTE — Assessment & Plan Note (Signed)
Recheck labs today  Results to dictate further management

## 2022-09-25 NOTE — Assessment & Plan Note (Signed)
Recheck levels today- results to dictate further management

## 2022-09-25 NOTE — Assessment & Plan Note (Signed)
Recheck cholesterol today  Results to dictate further management Encouraged diet and exercise efforts

## 2022-09-25 NOTE — Assessment & Plan Note (Signed)
She has lost almost 40 lbs since starting the [facility]  We reviewed increasing protein sources and vegetables - reducing carbs as tolerable We discussed doing weight and strength training to help with metabolism and further weight  We discussed other sources of protein such as quinoa and vegetable sources Recommend she continues with current Wegovy dose for now Follow up in 6 months or sooner if concerns arise

## 2022-09-27 LAB — COMPREHENSIVE METABOLIC PANEL
ALT: 20 IU/L (ref 0–32)
AST: 25 IU/L (ref 0–40)
Albumin: 4 g/dL (ref 3.8–4.9)
Alkaline Phosphatase: 81 IU/L (ref 44–121)
BUN/Creatinine Ratio: 13 (ref 12–28)
BUN: 11 mg/dL (ref 8–27)
Bilirubin Total: 0.4 mg/dL (ref 0.0–1.2)
CO2: 23 mmol/L (ref 20–29)
Calcium: 9.3 mg/dL (ref 8.7–10.3)
Chloride: 103 mmol/L (ref 96–106)
Creatinine, Ser: 0.87 mg/dL (ref 0.57–1.00)
Globulin, Total: 3 g/dL (ref 1.5–4.5)
Glucose: 91 mg/dL (ref 70–99)
Potassium: 3.8 mmol/L (ref 3.5–5.2)
Sodium: 139 mmol/L (ref 134–144)
Total Protein: 7 g/dL (ref 6.0–8.5)
eGFR: 76 mL/min/{1.73_m2} (ref >59)

## 2022-09-27 LAB — CBC WITH DIFFERENTIAL/PLATELET
Basophils Absolute: 0.1 10*3/uL (ref 0.0–0.2)
Basos: 1 %
EOS (ABSOLUTE): 0.2 10*3/uL (ref 0.0–0.4)
Eos: 3 %
Hematocrit: 39.3 % (ref 34.0–46.6)
Hemoglobin: 13.1 g/dL (ref 11.1–15.9)
Immature Grans (Abs): 0 10*3/uL (ref 0.0–0.1)
Immature Granulocytes: 0 %
Lymphocytes Absolute: 2.9 10*3/uL (ref 0.7–3.1)
Lymphs: 41 %
MCH: 31.9 pg (ref 26.6–33.0)
MCHC: 33.3 g/dL (ref 31.5–35.7)
MCV: 96 fL (ref 79–97)
Monocytes Absolute: 0.5 10*3/uL (ref 0.1–0.9)
Monocytes: 7 %
Neutrophils Absolute: 3.4 10*3/uL (ref 1.4–7.0)
Neutrophils: 48 %
Platelets: 203 10*3/uL (ref 150–450)
RBC: 4.11 x10E6/uL (ref 3.77–5.28)
RDW: 13.7 % (ref 11.7–15.4)
WBC: 7.1 10*3/uL (ref 3.4–10.8)

## 2022-09-27 LAB — LIPID PANEL
Chol/HDL Ratio: 3.3 ratio (ref 0.0–4.4)
Cholesterol, Total: 191 mg/dL (ref 100–199)
HDL: 58 mg/dL (ref >39)
LDL Chol Calc (NIH): 120 mg/dL — ABNORMAL HIGH (ref 0–99)
Triglycerides: 68 mg/dL (ref 0–149)
VLDL Cholesterol Cal: 13 mg/dL (ref 5–40)

## 2022-09-27 LAB — HEMOGLOBIN A1C
Est. average glucose Bld gHb Est-mCnc: 105 mg/dL
Hgb A1c MFr Bld: 5.3 % (ref 4.8–5.6)

## 2022-09-27 LAB — VITAMIN D 25 HYDROXY (VIT D DEFICIENCY, FRACTURES): Vit D, 25-Hydroxy: 63.3 ng/mL (ref 30.0–100.0)

## 2022-09-27 NOTE — Progress Notes (Signed)
Your cholesterol appears stable at this time  Your electrolytes, liver and kidney function appear to be  stable and in normal limits  Your CBC did not show signs of anemia Your A1c is 5.3 which is great  Your Vitamin D is in normal range  Please continue with your current medications at this time

## 2022-10-12 ENCOUNTER — Other Ambulatory Visit: Payer: Self-pay | Admitting: Family Medicine

## 2022-10-13 NOTE — Telephone Encounter (Signed)
Requested Prescriptions  Pending Prescriptions Disp Refills   estradiol (ESTRACE) 0.1 MG/GM vaginal cream [Pharmacy Med Name: Estradiol 0.1 MG/GM Vaginal Cream] 42.5 g 3    Sig: INSERT 1 GRAM VAGINALLY 3 TIMES  WEEKLY     OB/GYN:  Estrogens Passed - 10/12/2022  9:48 AM      Passed - Mammogram is up-to-date per Health Maintenance      Passed - Last BP in normal range    BP Readings from Last 1 Encounters:  09/25/22 116/82         Passed - Valid encounter within last 12 months    Recent Outpatient Visits           2 weeks ago Class 3 severe obesity due to excess calories without serious comorbidity with body mass index (BMI) of 40.0 to 44.9 in adult [facility])   [facility] Mecum, Oswaldo Conroy, PA-C   6 months ago Vitamin D deficiency   [facility] Turner, Clydie Braun, NP   8 months ago Class 2 obesity due to excess calories without serious comorbidity with body mass index (BMI) of 38.0 to 38.9 in adult   [facility] Gabriel Cirri, NP   10 months ago Class 2 obesity due to excess calories without serious comorbidity with body mass index (BMI) of 39.0 to 39.9 in adult   [facility] Gabriel Cirri, NP   1 year ago Routine medical exam   [facility] Gabriel Cirri, NP       Future Appointments             In 5 months Larae Grooms, NP  [facility]

## 2023-02-15 ENCOUNTER — Telehealth: Payer: Self-pay | Admitting: Nurse Practitioner

## 2023-02-15 NOTE — Telephone Encounter (Signed)
 Tried to initiate PA on Cover My Meds, kept saying patient not found. Reached out to the patient and confirmed with the patient that her insurance did not change and that her member ID is still the same. Will contact the patient's insurance to see what is going on.

## 2023-02-15 NOTE — Telephone Encounter (Signed)
 Patient called stated insurance is needing a prior auth for Semaglutide-Weight Management [facility]). Please f/u with patient.

## 2023-02-19 NOTE — Telephone Encounter (Signed)
 Contacted patient's insurance and completed the PA via the phone. PA has been approved.   Contacted [facility] and notified them of approval. Prescription was ran and has a $0 copay. '  Called and notified patient of approval.

## 2023-02-19 NOTE — Telephone Encounter (Signed)
 The patient called back stating she still hasn't heard anything about the prior authorization and that the insurance is correct. Please assist patient further

## 2023-04-02 ENCOUNTER — Ambulatory Visit: Payer: Self-pay | Admitting: Nurse Practitioner

## 2023-04-30 ENCOUNTER — Ambulatory Visit: Payer: Managed Care, Other (non HMO) | Admitting: Nurse Practitioner

## 2023-04-30 ENCOUNTER — Encounter: Payer: Self-pay | Admitting: Nurse Practitioner

## 2023-04-30 VITALS — BP 124/83 | HR 84 | Ht 61.5 in | Wt 177.4 lb

## 2023-04-30 DIAGNOSIS — E7800 Pure hypercholesterolemia, unspecified: Secondary | ICD-10-CM | POA: Diagnosis not present

## 2023-04-30 DIAGNOSIS — E66813 Obesity, class 3: Secondary | ICD-10-CM

## 2023-04-30 DIAGNOSIS — R7303 Prediabetes: Secondary | ICD-10-CM

## 2023-04-30 DIAGNOSIS — Z0000 Encounter for general adult medical examination without abnormal findings: Secondary | ICD-10-CM | POA: Diagnosis not present

## 2023-04-30 DIAGNOSIS — M069 Rheumatoid arthritis, unspecified: Secondary | ICD-10-CM

## 2023-04-30 DIAGNOSIS — E559 Vitamin D deficiency, unspecified: Secondary | ICD-10-CM

## 2023-04-30 DIAGNOSIS — Z6841 Body Mass Index (BMI) 40.0 and over, adult: Secondary | ICD-10-CM

## 2023-04-30 MED ORDER — WEGOVY 2.4 MG/0.75ML ~~LOC~~ SOAJ: 2.4000 mg | SUBCUTANEOUS | 2 refills | Status: DC

## 2023-04-30 MED ORDER — ESTRADIOL 0.1 MG/GM VA CREA: 1.0000 | TOPICAL_CREAM | VAGINAL | 3 refills | Status: DC

## 2023-04-30 MED ORDER — FLUTICASONE PROPIONATE 50 MCG/ACT NA SUSP: 2.0000 | Freq: Every day | NASAL | 6 refills | Status: AC

## 2023-04-30 NOTE — Assessment & Plan Note (Signed)
 Labs ordered at visit today.  Will make recommendations based on lab results.

## 2023-04-30 NOTE — Progress Notes (Signed)
 BP 124/83 (BP Location: Right Arm, Patient Position: Sitting, Cuff Size: Large)   Pulse 84   Ht 5' 1.5" (1.562 m)   Wt 177 lb 6.4 oz (80.5 kg)   SpO2 98%   BMI 32.98 kg/m    Subjective:    Patient ID: Stacey Marshall, female    DOB: Dec 10, 1960, 63 y.o.   MRN: [id number]  HPI: Stacey Marshall is a 63 y.o. female presenting on 04/30/2023 for comprehensive medical examination. Current medical complaints include:none  She currently lives with: Menopausal Symptoms: no  RHEUMATOID Taking Plaquenil 200mg  BID. She is up to date on her eye exams.  She see's Dr. Allena Katz.   Still on wegovy.  Her weight loss has slowed down some.  She doesn't have much of an appetite.  Needs to increase her protein and exercise.    Depression Screen done today and results listed below:     09/25/2022   10:20 AM 05/31/2022    8:20 AM 03/20/2022   10:42 AM 01/17/2022   10:38 AM 12/13/2021    3:06 PM  Depression screen PHQ 2/9  Decreased Interest 0 0 0 0 1  Down, Depressed, Hopeless 0 0 0 0 0  PHQ - 2 Score 0 0 0 0 1  Altered sleeping 1  1 1 3   Tired, decreased energy 0  0 1 2  Change in appetite 0  1 0 2  Feeling bad or failure about yourself  0  0 0 0  Trouble concentrating 0  0 0 0  Moving slowly or fidgety/restless 0  0 0 0  Suicidal thoughts 0  0 0 0  PHQ-9 Score 1  2 2 8   Difficult doing work/chores Not difficult at all  Not difficult at all Not difficult at all Not difficult at all    The patient does not have a history of falls. I did complete a risk assessment for falls. A plan of care for falls was documented.   Past Medical History:  Past Medical History:  Diagnosis Date   Back pain    Discoid lupus    Joint pain    Obesity    Rheumatoid arthritis (HCC)     Surgical History:  Past Surgical History:  Procedure Laterality Date   COLONOSCOPY WITH PROPOFOL N/A 08/19/2018   Procedure: COLONOSCOPY WITH PROPOFOL;  Surgeon: Toney Reil, MD;  Location: [facility] ENDOSCOPY;   Service: Gastroenterology;  Laterality: N/A;   MOUTH SURGERY      Medications:  Current Outpatient Medications on File Prior to Visit  Medication Sig   Cholecalciferol 25 MCG (1000 UT) tablet Take 1,000 Units by mouth daily.   hydroxychloroquine (PLAQUENIL) 200 MG tablet Take 200 mg by mouth 2 (two) times daily.    No current facility-administered medications on file prior to visit.    Allergies:  Allergies  Allergen Reactions   Bactrim [Sulfamethoxazole-Trimethoprim] Hives   Saxenda [Liraglutide -Weight Management] Rash    Social History:  Social History   Socioeconomic History   Marital status: Single    Spouse name: Not on file   Number of children: Not on file   Years of education: Not on file   Highest education level: Some college, no degree  Occupational History   Occupation: [employer]  Tobacco Use   Smoking status: Never   Smokeless tobacco: Never  Vaping Use   Vaping status: Never Used  Substance and Sexual Activity   Alcohol use: Yes    Alcohol/week: 2.0 -  3.0 standard drinks of alcohol    Types: 2 - 3 Glasses of wine per week   Drug use: No   Sexual activity: Not Currently    Birth control/protection: None  Other Topics Concern   Not on file  Social History Narrative   Not on file   Social Drivers of Health   Financial Resource Strain: Low Risk  (04/29/2023)   Overall Financial Resource Strain (CARDIA)    Difficulty of Paying Living Expenses: Not very hard  Food Insecurity: No Food Insecurity (04/29/2023)   Hunger Vital Sign    Worried About Running Out of Food in the Last Year: Never true    Ran Out of Food in the Last Year: Never true  Transportation Needs: No Transportation Needs (04/29/2023)   PRAPARE - Administrator, Civil Service (Medical): No    Lack of Transportation (Non-Medical): No  Physical Activity: Sufficiently Active (04/29/2023)   Exercise Vital Sign    Days of Exercise per Week: 5 days    Minutes of Exercise  per Session: 40 min  Stress: No Stress Concern Present (04/29/2023)   [employer] of Occupational Health - Occupational Stress Questionnaire    Feeling of Stress : Not at all  Social Connections: Moderately Isolated (04/29/2023)   Social Connection and Isolation Panel [NHANES]    Frequency of Communication with Friends and Family: More than three times a week    Frequency of Social Gatherings with Friends and Family: Once a week    Attends Religious Services: 1 to 4 times per year    Active Member of [employer] or Organizations: No    Attends Engineer, structural: Not on file    Marital Status: Never married  Catering manager Violence: Not on file   Social History   Tobacco Use  Smoking Status Never  Smokeless Tobacco Never   Social History   Substance and Sexual Activity  Alcohol Use Yes   Alcohol/week: 2.0 - 3.0 standard drinks of alcohol   Types: 2 - 3 Glasses of wine per week    Family History:  Family History  Problem Relation Age of Onset   Diabetes Mother    Hypertension Mother    AAA (abdominal aortic aneurysm) Mother    Diabetes Father    Hypertension Father    Hypertension Sister    Diabetes Sister    Hypertension Sister    Hypertension Brother    Hyperlipidemia Brother    Diabetes Brother     Past medical history, surgical history, medications, allergies, family history and social history reviewed with patient today and changes made to appropriate areas of the chart.   Review of Systems  Eyes:  Negative for blurred vision and double vision.  Respiratory:  Negative for shortness of breath.   Cardiovascular:  Negative for chest pain, palpitations and leg swelling.  Neurological:  Negative for dizziness and headaches.   All other ROS negative except what is listed above and in the HPI.      Objective:    BP 124/83 (BP Location: Right Arm, Patient Position: Sitting, Cuff Size: Large)   Pulse 84   Ht 5' 1.5" (1.562 m)   Wt 177 lb 6.4 oz (80.5  kg)   SpO2 98%   BMI 32.98 kg/m   Wt Readings from Last 3 Encounters:  04/30/23 177 lb 6.4 oz (80.5 kg)  09/25/22 186 lb 6.4 oz (84.6 kg)  05/31/22 192 lb 11.2 oz (87.4 kg)    Physical  Exam Vitals and nursing note reviewed.  Constitutional:      General: She is awake. She is not in acute distress.    Appearance: Normal appearance. She is well-developed. She is obese. She is not ill-appearing.  HENT:     Head: Normocephalic and atraumatic.     Right Ear: Hearing, tympanic membrane, ear canal and external ear normal. No drainage.     Left Ear: Hearing, tympanic membrane, ear canal and external ear normal. No drainage.     Nose: Nose normal.     Right Sinus: No maxillary sinus tenderness or frontal sinus tenderness.     Left Sinus: No maxillary sinus tenderness or frontal sinus tenderness.     Mouth/Throat:     Mouth: Mucous membranes are moist.     Pharynx: Oropharynx is clear. Uvula midline. No pharyngeal swelling, oropharyngeal exudate or posterior oropharyngeal erythema.  Eyes:     General: Lids are normal.        Right eye: No discharge.        Left eye: No discharge.     Extraocular Movements: Extraocular movements intact.     Conjunctiva/sclera: Conjunctivae normal.     Pupils: Pupils are equal, round, and reactive to light.     Visual Fields: Right eye visual fields normal and left eye visual fields normal.  Neck:     Thyroid: No thyromegaly.     Vascular: No carotid bruit.     Trachea: Trachea normal.  Cardiovascular:     Rate and Rhythm: Normal rate and regular rhythm.     Heart sounds: Normal heart sounds. No murmur heard.    No gallop.  Pulmonary:     Effort: Pulmonary effort is normal. No accessory muscle usage or respiratory distress.     Breath sounds: Normal breath sounds.  Chest:  Breasts:    Right: Normal.     Left: Normal.  Abdominal:     General: Bowel sounds are normal.     Palpations: Abdomen is soft. There is no hepatomegaly or splenomegaly.      Tenderness: There is no abdominal tenderness.  Musculoskeletal:        General: Normal range of motion.     Cervical back: Normal range of motion and neck supple.     Right lower leg: No edema.     Left lower leg: No edema.  Lymphadenopathy:     Head:     Right side of head: No submental, submandibular, tonsillar, preauricular or posterior auricular adenopathy.     Left side of head: No submental, submandibular, tonsillar, preauricular or posterior auricular adenopathy.     Cervical: No cervical adenopathy.     Upper Body:     Right upper body: No supraclavicular, axillary or pectoral adenopathy.     Left upper body: No supraclavicular, axillary or pectoral adenopathy.  Skin:    General: Skin is warm and dry.     Capillary Refill: Capillary refill takes less than 2 seconds.     Findings: No rash.  Neurological:     Mental Status: She is alert and oriented to person, place, and time.     Gait: Gait is intact.  Psychiatric:        Attention and Perception: Attention normal.        Mood and Affect: Mood normal.        Speech: Speech normal.        Behavior: Behavior normal. Behavior is cooperative.  Thought Content: Thought content normal.        Judgment: Judgment normal.     Results for orders placed or performed in visit on 09/25/22  Comp Met (CMET)   Collection Time: 09/26/22  9:51 AM  Result Value Ref Range   Glucose 91 70 - 99 mg/dL   BUN 11 8 - 27 mg/dL   Creatinine, Ser 7.82 0.57 - 1.00 mg/dL   eGFR 76 >95 AO/ZHY/8.65   BUN/Creatinine Ratio 13 12 - 28   Sodium 139 134 - 144 mmol/L   Potassium 3.8 3.5 - 5.2 mmol/L   Chloride 103 96 - 106 mmol/L   CO2 23 20 - 29 mmol/L   Calcium 9.3 8.7 - 10.3 mg/dL   Total Protein 7.0 6.0 - 8.5 g/dL   Albumin 4.0 3.8 - 4.9 g/dL   Globulin, Total 3.0 1.5 - 4.5 g/dL   Bilirubin Total 0.4 0.0 - 1.2 mg/dL   Alkaline Phosphatase 81 44 - 121 IU/L   AST 25 0 - 40 IU/L   ALT 20 0 - 32 IU/L  CBC w/Diff   Collection Time: 09/26/22   9:51 AM  Result Value Ref Range   WBC 7.1 3.4 - 10.8 x10E3/uL   RBC 4.11 3.77 - 5.28 x10E6/uL   Hemoglobin 13.1 11.1 - 15.9 g/dL   Hematocrit 78.4 69.6 - 46.6 %   MCV 96 79 - 97 fL   MCH 31.9 26.6 - 33.0 pg   MCHC 33.3 31.5 - 35.7 g/dL   RDW 29.5 28.4 - 13.2 %   Platelets 203 150 - 450 x10E3/uL   Neutrophils 48 Not Estab. %   Lymphs 41 Not Estab. %   Monocytes 7 Not Estab. %   Eos 3 Not Estab. %   Basos 1 Not Estab. %   Neutrophils Absolute 3.4 1.4 - 7.0 x10E3/uL   Lymphocytes Absolute 2.9 0.7 - 3.1 x10E3/uL   Monocytes Absolute 0.5 0.1 - 0.9 x10E3/uL   EOS (ABSOLUTE) 0.2 0.0 - 0.4 x10E3/uL   Basophils Absolute 0.1 0.0 - 0.2 x10E3/uL   Immature Granulocytes 0 Not Estab. %   Immature Grans (Abs) 0.0 0.0 - 0.1 x10E3/uL  Lipid Profile   Collection Time: 09/26/22  9:51 AM  Result Value Ref Range   Cholesterol, Total 191 100 - 199 mg/dL   Triglycerides 68 0 - 149 mg/dL   HDL 58 >44 mg/dL   VLDL Cholesterol Cal 13 5 - 40 mg/dL   LDL Chol Calc (NIH) 010 (H) 0 - 99 mg/dL   Chol/HDL Ratio 3.3 0.0 - 4.4 ratio  HgB A1c   Collection Time: 09/26/22  9:51 AM  Result Value Ref Range   Hgb A1c MFr Bld 5.3 4.8 - 5.6 %   Est. average glucose Bld gHb Est-mCnc 105 mg/dL  Vitamin D (25 hydroxy)   Collection Time: 09/26/22  9:51 AM  Result Value Ref Range   Vit D, 25-Hydroxy 63.3 30.0 - 100.0 ng/mL      Assessment & Plan:   Problem List Items Addressed This Visit       Musculoskeletal and Integument   Rheumatoid arthritis, involving unspecified site, unspecified whether rheumatoid factor present (HCC)     Other   Class 3 severe obesity due to excess calories without serious comorbidity with body mass index (BMI) of 40.0 to 44.9 in adult [facility])   Relevant Medications   Semaglutide-Weight Management (WEGOVY) 2.4 MG/0.75ML SOAJ   Hyperlipemia   Relevant Orders   Lipid panel  Prediabetes   Relevant Orders   Hemoglobin A1c   Vitamin D deficiency   Relevant Orders   Vitamin D (25  hydroxy)   Other Visit Diagnoses       Annual physical exam    -  Primary   Health maintenance reviewed during visit today.  Labs ordered.  Vaccines reviewed.  PAP, Mammogram and Colonoscopy up to date.   Relevant Orders   CBC with Differential/Platelet   Comprehensive metabolic panel   Lipid panel   TSH   Urinalysis, Routine w reflex microscopic   Hemoglobin A1c   Vitamin D (25 hydroxy)        Follow up plan: Return in about 6 months (around 10/31/2023) for HTN, HLD, DM2 FU.   LABORATORY TESTING:  - Pap smear: up to date  IMMUNIZATIONS:   - Tdap: Tetanus vaccination status reviewed: last tetanus booster within 10 years. - Influenza: Up to date - Pneumovax: Not applicable - Prevnar: Not applicable - COVID: Up to date - HPV: Not applicable - Shingrix vaccine: Up to date  SCREENING: -Mammogram: Up to date  - Colonoscopy: Up to date  - Bone Density: Not applicable  -Hearing Test: Not applicable  -Spirometry: Not applicable   PATIENT COUNSELING:   Advised to take 1 mg of folate supplement per day if capable of pregnancy.   Sexuality: Discussed sexually transmitted diseases, partner selection, use of condoms, avoidance of unintended pregnancy  and contraceptive alternatives.   Advised to avoid cigarette smoking.  I discussed with the patient that most people either abstain from alcohol or drink within safe limits (<=14/week and <=4 drinks/occasion for males, <=7/weeks and <= 3 drinks/occasion for females) and that the risk for alcohol disorders and other health effects rises proportionally with the number of drinks per week and how often a drinker exceeds daily limits.  Discussed cessation/primary prevention of drug use and availability of treatment for abuse.   Diet: Encouraged to adjust caloric intake to maintain  or achieve ideal body weight, to reduce intake of dietary saturated fat and total fat, to limit sodium intake by avoiding high sodium foods and not adding  table salt, and to maintain adequate dietary potassium and calcium preferably from fresh fruits, vegetables, and low-fat dairy products.    stressed the importance of regular exercise  Injury prevention: Discussed safety belts, safety helmets, smoke detector, smoking near bedding or upholstery.   Dental health: Discussed importance of regular tooth brushing, flossing, and dental visits.    NEXT PREVENTATIVE PHYSICAL DUE IN 1 YEAR. Return in about 6 months (around 10/31/2023) for HTN, HLD, DM2 FU.

## 2023-04-30 NOTE — Assessment & Plan Note (Signed)
 Chronic.  Continues to improve with Wegovy.  Has lost about 15lbs since April 2024.  Encouraged patient to increase protein intake and increase exercise.

## 2023-04-30 NOTE — Assessment & Plan Note (Signed)
 Chronic, Controlled. On Plaquenil 200mg  BID.   She is regularly followed by Rheumatology  Defer to their recommendations- Continue with current medication regimen

## 2023-05-01 ENCOUNTER — Other Ambulatory Visit: Payer: Self-pay | Admitting: Nurse Practitioner

## 2023-05-02 ENCOUNTER — Encounter: Payer: Self-pay | Admitting: Nurse Practitioner

## 2023-05-02 LAB — LIPID PANEL
Chol/HDL Ratio: 2.6 ratio (ref 0.0–4.4)
Cholesterol, Total: 177 mg/dL (ref 100–199)
HDL: 68 mg/dL (ref >39)
LDL Chol Calc (NIH): 99 mg/dL (ref 0–99)
Triglycerides: 50 mg/dL (ref 0–149)
VLDL Cholesterol Cal: 10 mg/dL (ref 5–40)

## 2023-05-02 LAB — COMPREHENSIVE METABOLIC PANEL
ALT: 17 IU/L (ref 0–32)
AST: 24 IU/L (ref 0–40)
Albumin: 4 g/dL (ref 3.8–4.9)
Alkaline Phosphatase: 77 IU/L (ref 44–121)
BUN/Creatinine Ratio: 23 (ref 12–28)
BUN: 19 mg/dL (ref 8–27)
Bilirubin Total: 0.3 mg/dL (ref 0.0–1.2)
CO2: 21 mmol/L (ref 20–29)
Calcium: 9.6 mg/dL (ref 8.7–10.3)
Chloride: 102 mmol/L (ref 96–106)
Creatinine, Ser: 0.82 mg/dL (ref 0.57–1.00)
Globulin, Total: 2.8 g/dL (ref 1.5–4.5)
Glucose: 78 mg/dL (ref 70–99)
Potassium: 4 mmol/L (ref 3.5–5.2)
Sodium: 137 mmol/L (ref 134–144)
Total Protein: 6.8 g/dL (ref 6.0–8.5)
eGFR: 82 mL/min/{1.73_m2} (ref >59)

## 2023-05-02 LAB — URINALYSIS, ROUTINE W REFLEX MICROSCOPIC
Bilirubin, UA: NEGATIVE
Glucose, UA: NEGATIVE
Ketones, UA: NEGATIVE
Leukocytes,UA: NEGATIVE
Nitrite, UA: NEGATIVE
Protein,UA: NEGATIVE
RBC, UA: NEGATIVE
Specific Gravity, UA: 1.017 (ref 1.005–1.030)
Urobilinogen, Ur: 0.2 mg/dL (ref 0.2–1.0)
pH, UA: 6 (ref 5.0–7.5)

## 2023-05-02 LAB — HEMOGLOBIN A1C
Est. average glucose Bld gHb Est-mCnc: 105 mg/dL
Hgb A1c MFr Bld: 5.3 % (ref 4.8–5.6)

## 2023-05-02 LAB — VITAMIN D 25 HYDROXY (VIT D DEFICIENCY, FRACTURES): Vit D, 25-Hydroxy: 57.6 ng/mL (ref 30.0–100.0)

## 2023-05-02 LAB — CBC WITH DIFFERENTIAL/PLATELET
Basophils Absolute: 0 10*3/uL (ref 0.0–0.2)
Basos: 0 %
EOS (ABSOLUTE): 0.2 10*3/uL (ref 0.0–0.4)
Eos: 3 %
Hematocrit: 38.9 % (ref 34.0–46.6)
Hemoglobin: 13 g/dL (ref 11.1–15.9)
Immature Grans (Abs): 0 10*3/uL (ref 0.0–0.1)
Immature Granulocytes: 0 %
Lymphocytes Absolute: 3.4 10*3/uL — ABNORMAL HIGH (ref 0.7–3.1)
Lymphs: 47 %
MCH: 32.6 pg (ref 26.6–33.0)
MCHC: 33.4 g/dL (ref 31.5–35.7)
MCV: 98 fL — ABNORMAL HIGH (ref 79–97)
Monocytes Absolute: 0.6 10*3/uL (ref 0.1–0.9)
Monocytes: 8 %
Neutrophils Absolute: 3.1 10*3/uL (ref 1.4–7.0)
Neutrophils: 42 %
Platelets: 183 10*3/uL (ref 150–450)
RBC: 3.99 x10E6/uL (ref 3.77–5.28)
RDW: 14.6 % (ref 11.7–15.4)
WBC: 7.3 10*3/uL (ref 3.4–10.8)

## 2023-05-02 LAB — TSH: TSH: 1.8 u[IU]/mL (ref 0.450–4.500)

## 2023-08-28 ENCOUNTER — Telehealth: Payer: Self-pay | Admitting: Nurse Practitioner

## 2023-08-28 ENCOUNTER — Other Ambulatory Visit: Payer: Self-pay | Admitting: Nurse Practitioner

## 2023-08-28 DIAGNOSIS — E66813 Obesity, class 3: Secondary | ICD-10-CM

## 2023-08-28 NOTE — Telephone Encounter (Signed)
 Copied from CRM [id number]. Topic: Clinical - Medication Question >> Aug 28, 2023  2:00 PM Precious C wrote: Reason for CRM: Patient called in to request a medication refill. Upon reviewing her chart, it was noted that she has t2 refills remaining. Patient was advised to contact the pharmacy to confirm if the refills are available, and if not, she was advised to call back. Patient agreed and stated she will follow up with the pharmacy and call back if needed.

## 2023-08-30 ENCOUNTER — Other Ambulatory Visit: Payer: Self-pay | Admitting: Nurse Practitioner

## 2023-08-30 DIAGNOSIS — Z6841 Body Mass Index (BMI) 40.0 and over, adult: Secondary | ICD-10-CM

## 2023-08-30 NOTE — Telephone Encounter (Signed)
 Last OV 04/30/2023

## 2023-08-30 NOTE — Telephone Encounter (Unsigned)
 Copied from CRM [id number]. Topic: Clinical - Medication Refill >> Aug 30, 2023  9:45 AM Travis F wrote: Medication: Semaglutide -Weight Management (WEGOVY ) 2.4 MG/0.75ML SOAJ [[id number]]  Has the patient contacted their pharmacy? Yes  (Agent: If yes, when and what did the pharmacy advise?) Contact office, request was also sent by pharmacy.  This is the patient's preferred pharmacy:   [facility] [address] - [place], [place] - [address] [place] [address] Phone: [phone] Fax: [phone]  Is this the correct pharmacy for this prescription? Yes If no, delete pharmacy and type the correct one.   Has the prescription been filled recently? Yes  Is the patient out of the medication? Yes  Has the patient been seen for an appointment in the last year OR does the patient have an upcoming appointment? Yes  Can we respond through MyChart? Yes  Agent: Please be advised that Rx refills may take up to 3 business days. We ask that you follow-up with your pharmacy.

## 2023-08-30 NOTE — Telephone Encounter (Signed)
 Requested Prescriptions  Pending Prescriptions Disp Refills   WEGOVY  2.4 MG/0.75ML SOAJ [Pharmacy Med Name: WEGOVY  2.4 MG/0.75 ML PEN[**$R^]] 3 mL 2    Sig: INJECT THE CONTENTS OF ONE PEN UNDER THE SKIN ONCE WEEKLY ON THE SAME DAY EACH WEEK     Endocrinology:  Diabetes - GLP-1 Receptor Agonists - semaglutide  Passed - 08/30/2023  1:58 PM      Passed - HBA1C in normal range and within 180 days    HB A1C (BAYER DCA - WAIVED)  Date Value Ref Range Status  08/06/2020 5.4 <7.0 % Final    Comment:                                          Diabetic Adult            <7.0                                       Healthy Adult        4.3 - 5.7                                                           (DCCT/NGSP) American Diabetes Association's Summary of Glycemic Recommendations for Adults with Diabetes: Hemoglobin A1c <7.0%. More stringent glycemic goals (A1c <6.0%) may further reduce complications at the cost of increased risk of hypoglycemia.    Hgb A1c MFr Bld  Date Value Ref Range Status  05/01/2023 5.3 4.8 - 5.6 % Final    Comment:             Prediabetes: 5.7 - 6.4          Diabetes: >6.4          Glycemic control for adults with diabetes: <7.0          Passed - Cr in normal range and within 360 days    Creatinine, Ser  Date Value Ref Range Status  05/01/2023 0.82 0.57 - 1.00 mg/dL Final         Passed - Valid encounter within last 6 months    Recent Outpatient Visits           4 months ago Annual physical exam   [facility] Melvin Pao, NP

## 2023-08-31 NOTE — Telephone Encounter (Signed)
 Refused Wegovy  refill request.   This is a duplicate.  Sent 08/30/2023 3 ml, 2 refills.

## 2023-09-12 ENCOUNTER — Encounter: Admitting: Nurse Practitioner

## 2023-09-24 NOTE — Progress Notes (Signed)
    GYNECOLOGY PROGRESS NOTE  Subjective:  PCP: Melvin Pao, NP  Patient ID: Stacey Marshall, female    DOB: 04-Mar-1960, 63 y.o.   MRN: [id number]  HPI  Patient is a 63 y.o. 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 PMH of RA and discoid lupus. Complete annual physical exam done 04/30/23 with family medicine.  Patient has a history of abnormal pap smears with normal colposcopies:  Pap Smears: 06/01/22: NILM, HR HPV + (16/18 negative) 09/06/20: NILM, HPV 18 and 45 + (16 negative)    07/28/19: NILM, HR HPV + (16/18 negative)  06/07/16: NILM, HPV 18 and 45 + (16 negative)  Colposcopies: 11/01/20:  12:00 - Koilocytosis suggestive of HPV effect, benign ECC 09/01/19:  12:00 - benign ectocervical with chronic cervicitis, no dysplasia.  ECC = squamous metaplasia, no dysplasia  The following portions of the patient's history were reviewed and updated as appropriate: allergies, current medications, past family history, past medical history, past social history, past surgical history, and problem list.  Review of Systems Pertinent items are noted in HPI.   Objective:   Blood pressure 128/81, pulse 75, height 5' 1.5 (1.562 m), weight 179 lb 4.8 oz (81.3 kg). Body mass index is 33.33 kg/m.  General appearance: alert, cooperative, no distress, and mildly obese Abdomen: soft, non-tender; bowel sounds normal; no masses,  no organomegaly Pelvic: external genitalia normal, no adnexal masses or tenderness, no cervical motion tenderness, rectovaginal septum normal, uterus normal size, shape, and consistency, and cervix flush with posterior cul-de-sac and stenotic; dry, atrophic vaginal mucosa Extremities: extremities normal, atraumatic, no cyanosis or edema Neurologic: Grossly normal  Assessment/Plan:   1. Cervical high risk HPV (human papillomavirus) test positive   2. Vaginal dryness, menopausal   3. Vaginal atrophy    Repeat pap with cotesting today; if HPV positive,  will need another colposcopy Continue Estrace  3 x weekly    Estil Mangle, DO [facility]

## 2023-10-02 ENCOUNTER — Other Ambulatory Visit (HOSPITAL_COMMUNITY)
Admission: RE | Admit: 2023-10-02 | Discharge: 2023-10-02 | Disposition: A | Discharge status: Home / Self Care | Source: Ambulatory Visit | Attending: Obstetrics | Admitting: Obstetrics

## 2023-10-02 ENCOUNTER — Ambulatory Visit: Admitting: Obstetrics

## 2023-10-02 ENCOUNTER — Encounter: Payer: Self-pay | Admitting: Obstetrics

## 2023-10-02 VITALS — BP 128/81 | HR 75 | Ht 61.5 in | Wt 179.3 lb

## 2023-10-02 DIAGNOSIS — N951 Menopausal and female climacteric states: Secondary | ICD-10-CM

## 2023-10-02 DIAGNOSIS — N952 Postmenopausal atrophic vaginitis: Secondary | ICD-10-CM

## 2023-10-02 DIAGNOSIS — R87810 Cervical high risk human papillomavirus (HPV) DNA test positive: Secondary | ICD-10-CM

## 2023-10-02 HISTORY — DX: Cervical high risk human papillomavirus (HPV) DNA test positive: R87.810

## 2023-10-03 ENCOUNTER — Encounter: Payer: Self-pay | Admitting: Family Medicine

## 2023-10-03 LAB — HM MAMMOGRAPHY

## 2023-10-05 LAB — CYTOLOGY - PAP
Adequacy: ABSENT
Comment: NEGATIVE
High risk HPV: NEGATIVE

## 2023-10-10 ENCOUNTER — Ambulatory Visit: Payer: Self-pay | Admitting: Obstetrics

## 2023-10-10 NOTE — Telephone Encounter (Addendum)
-----   Message from Nedrow sent at 10/10/2023 10:01 AM EDT ----- Please schedule pt for a GYN follow up visit for pap results  Thanks!

## 2023-10-26 NOTE — Progress Notes (Unsigned)
    GYNECOLOGY PROGRESS NOTE  Subjective:  PCP: Melvin Pao, NP  Patient ID: Stacey Marshall, female    DOB: [birth year], 63 y.o.   MRN: [id number]  HPI  Patient is a 63 y.o. G0P0000 female who presents for discussion of pap results.  Pap smear on 10/02/23 showed atrophic pattern with epithelial atypia.  Hpv negative.  {Common ambulatory SmartLinks:19316}  Review of Systems {ros; complete:30496}   Objective:   Blood pressure 136/74, pulse 79, weight 179 lb 8 oz (81.4 kg). Body mass index is 33.37 kg/m.  General appearance: {general exam:16600} Abdomen: {abdominal exam:16834} Pelvic: {pelvic exam:16852::cervix normal in appearance,external genitalia normal,no adnexal masses or tenderness,no cervical motion tenderness,rectovaginal septum normal,uterus normal size, shape, and consistency,vagina normal without discharge} Extremities: {extremity exam:5109} Neurologic: {neuro exam:17854}  Pap 10/02/23 HIGH RISK HPV ([place]): Negative ADEQUACY: Satisfactory for evaluation; transformation zone component ABSENT. DIAGNOSIS: - Atrophic pattern with epithelial atypia Abnormal  COMMENT - CYTOLOGY: Atrophic changes are present. COMMENT (MOLECULAR): Normal Reference Range HPV - Negative  Assessment/Plan:   No diagnosis found.  Increase Estrace  from 1 x week to 3 x week for 6 mos, then repeat pap   Estil Mangle, DO [facility]

## 2023-10-30 ENCOUNTER — Ambulatory Visit: Admitting: Obstetrics

## 2023-10-30 ENCOUNTER — Encounter: Payer: Self-pay | Admitting: Obstetrics

## 2023-10-30 VITALS — BP 136/74 | HR 79 | Wt 179.5 lb

## 2023-10-30 DIAGNOSIS — N952 Postmenopausal atrophic vaginitis: Secondary | ICD-10-CM | POA: Diagnosis not present

## 2023-11-01 ENCOUNTER — Encounter: Payer: Self-pay | Admitting: Obstetrics

## 2023-11-05 ENCOUNTER — Ambulatory Visit: Admitting: Nurse Practitioner

## 2023-11-27 ENCOUNTER — Other Ambulatory Visit: Payer: Self-pay | Admitting: Nurse Practitioner

## 2023-11-29 NOTE — Telephone Encounter (Signed)
 Requested Prescriptions  Pending Prescriptions Disp Refills   estradiol  (ESTRACE ) 0.01 % CREA vaginal cream [Pharmacy Med Name: Estradiol  0.1 MG/GM Vaginal Cream] 42.5 g 2    Sig: INSERT 1 GRAM VAGINALLY 3 TIMES  WEEKLY     OB/GYN:  Estrogens Passed - 11/29/2023  3:10 PM      Passed - Mammogram is up-to-date per Health Maintenance      Passed - Last BP in normal range    BP Readings from Last 1 Encounters:  10/30/23 136/74         Passed - Valid encounter within last 12 months    Recent Outpatient Visits           7 months ago Annual physical exam    [facility] Melvin Pao, NP

## 2023-12-06 ENCOUNTER — Other Ambulatory Visit: Payer: Self-pay | Admitting: Nurse Practitioner

## 2023-12-06 DIAGNOSIS — E66813 Obesity, class 3: Secondary | ICD-10-CM

## 2023-12-08 NOTE — Telephone Encounter (Signed)
 Requested Prescriptions  Pending Prescriptions Disp Refills   WEGOVY  2.4 MG/0.75ML SOAJ SQ injection [Pharmacy Med Name: WEGOVY  2.4 MG/0.75 ML PEN[**$R^]] 3 mL 2    Sig: INJECT THE CONTENTS OF ONE PEN UNDER THE SKIN ONCE WEEKLY ON THE SAME DAY EACH WEEK     Endocrinology:  Diabetes - GLP-1 Receptor Agonists - semaglutide  Failed - 12/08/2023  8:29 AM      Failed - HBA1C in normal range and within 180 days    HB A1C (BAYER DCA - WAIVED)  Date Value Ref Range Status  08/06/2020 5.4 <7.0 % Final    Comment:                                          Diabetic Adult            <7.0                                       Healthy Adult        4.3 - 5.7                                                           (DCCT/NGSP) American Diabetes Association's Summary of Glycemic Recommendations for Adults with Diabetes: Hemoglobin A1c <7.0%. More stringent glycemic goals (A1c <6.0%) may further reduce complications at the cost of increased risk of hypoglycemia.    Hgb A1c MFr Bld  Date Value Ref Range Status  05/01/2023 5.3 4.8 - 5.6 % Final    Comment:             Prediabetes: 5.7 - 6.4          Diabetes: >6.4          Glycemic control for adults with diabetes: <7.0          Failed - Valid encounter within last 6 months    Recent Outpatient Visits           7 months ago Annual physical exam   [place] [facility] Melvin Pao, NP              Passed - Cr in normal range and within 360 days    Creatinine, Ser  Date Value Ref Range Status  05/01/2023 0.82 0.57 - 1.00 mg/dL Final

## 2023-12-14 ENCOUNTER — Ambulatory Visit: Admitting: Nurse Practitioner

## 2024-03-03 ENCOUNTER — Other Ambulatory Visit: Payer: Self-pay | Admitting: Nurse Practitioner

## 2024-03-03 DIAGNOSIS — Z6841 Body Mass Index (BMI) 40.0 and over, adult: Secondary | ICD-10-CM

## 2024-03-04 NOTE — Telephone Encounter (Signed)
 Requested medications are due for refill today.  yes  Requested medications are on the active medications list.  yes  Last refill. 12/08/2023 3mL 2 rf  Future visit scheduled.   no  Notes to clinic.  Pt needs an appt. Labs are expired.    Requested Prescriptions  Pending Prescriptions Disp Refills   WEGOVY  2.4 MG/0.75ML SOAJ SQ injection [Pharmacy Med Name: WEGOVY  2.4 MG/0.75 ML PEN[**$R^]]  2    Sig: INJECT THE CONTENTS OF ONE PEN UNDER THE SKIN ONCE WEEKLY ON THE SAME DAY EACH WEEK     Endocrinology:  Diabetes - GLP-1 Receptor Agonists - semaglutide  Failed - 03/04/2024  2:25 PM      Failed - HBA1C in normal range and within 180 days    HB A1C (BAYER DCA - WAIVED)  Date Value Ref Range Status  08/06/2020 5.4 <7.0 % Final    Comment:                                          Diabetic Adult            <7.0                                       Healthy Adult        4.3 - 5.7                                                           (DCCT/NGSP) American Diabetes Association's Summary of Glycemic Recommendations for Adults with Diabetes: Hemoglobin A1c <7.0%. More stringent glycemic goals (A1c <6.0%) may further reduce complications at the cost of increased risk of hypoglycemia.    Hgb A1c MFr Bld  Date Value Ref Range Status  05/01/2023 5.3 4.8 - 5.6 % Final    Comment:             Prediabetes: 5.7 - 6.4          Diabetes: >6.4          Glycemic control for adults with diabetes: <7.0          Failed - Valid encounter within last 6 months    Recent Outpatient Visits           10 months ago Annual physical exam   [place] [facility] Melvin Pao, NP              Passed - Cr in normal range and within 360 days    Creatinine, Ser  Date Value Ref Range Status  05/01/2023 0.82 0.57 - 1.00 mg/dL Final

## 2024-04-29 ENCOUNTER — Ambulatory Visit: Admitting: Obstetrics
